# Patient Record
Sex: Female | Born: 1945 | Race: White | Hispanic: Yes | Marital: Married | Smoking: Former smoker
Health system: Southern US, Community
[De-identification: ages and names within clinical notes are randomized; demographics above are authoritative.]

## PROBLEM LIST (undated history)

## (undated) DIAGNOSIS — I4891 Unspecified atrial fibrillation: Secondary | ICD-10-CM

## (undated) DIAGNOSIS — I099 Rheumatic heart disease, unspecified: Secondary | ICD-10-CM

## (undated) DIAGNOSIS — R569 Unspecified convulsions: Secondary | ICD-10-CM

## (undated) DIAGNOSIS — E785 Hyperlipidemia, unspecified: Secondary | ICD-10-CM

## (undated) DIAGNOSIS — I639 Cerebral infarction, unspecified: Secondary | ICD-10-CM

## (undated) DIAGNOSIS — E119 Type 2 diabetes mellitus without complications: Secondary | ICD-10-CM

## (undated) HISTORY — DX: Rheumatic heart disease, unspecified: I09.9

## (undated) HISTORY — DX: Hyperlipidemia, unspecified: E78.5

## (undated) HISTORY — PX: INSERT / REPLACE / REMOVE PACEMAKER: SUR710

## (undated) HISTORY — DX: Cerebral infarction, unspecified: I63.9

## (undated) HISTORY — PX: APPENDECTOMY: SHX54

## (undated) HISTORY — DX: Unspecified convulsions: R56.9

## (undated) HISTORY — DX: Unspecified atrial fibrillation: I48.91

## (undated) HISTORY — PX: MITRAL VALVE REPLACEMENT: SHX147

---

## 2015-06-20 DIAGNOSIS — I639 Cerebral infarction, unspecified: Secondary | ICD-10-CM

## 2015-06-20 HISTORY — DX: Cerebral infarction, unspecified: I63.9

## 2015-11-12 ENCOUNTER — Telehealth: Payer: Self-pay | Admitting: Internal Medicine

## 2015-11-12 NOTE — Telephone Encounter (Signed)
Called pt and left message for pt to give our office a call back to update Fm and medical Hx and to get information to request medical records.

## 2015-11-17 ENCOUNTER — Telehealth: Payer: Self-pay | Admitting: Internal Medicine

## 2015-11-17 ENCOUNTER — Encounter: Payer: Self-pay | Admitting: Internal Medicine

## 2015-11-17 ENCOUNTER — Ambulatory Visit (INDEPENDENT_AMBULATORY_CARE_PROVIDER_SITE_OTHER): Payer: Medicare Other | Admitting: Internal Medicine

## 2015-11-17 VITALS — BP 132/72 | HR 81 | Ht 63.0 in | Wt 129.8 lb

## 2015-11-17 DIAGNOSIS — I482 Chronic atrial fibrillation, unspecified: Secondary | ICD-10-CM

## 2015-11-17 NOTE — Progress Notes (Signed)
      HPI Mrs. Fodera is referred today for ongoing evaluation and management of her PPM. She is a pleasant 70 yo woman with a h/o mitral valve disease, s/p MVR in 1984 and then again in 2002. She developed CHB and underwent PPM insertion in 1984 and then most recently in 2012. She has a Sorin device and appears to have underlying CHB in the setting of atrial fibrillation. 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 She denies chest pain.  Not on File   Current Outpatient Prescriptions  Medication Sig Dispense Refill  . carvedilol (COREG) 6.25 MG tablet Take 6.25 mg by mouth daily.    Marland Kitchen lisinopril (PRINIVIL,ZESTRIL) 5 MG tablet Take 5 mg by mouth daily.    Marland Kitchen spironolactone (ALDACTONE) 25 MG tablet Take 25 mg by mouth as directed.    . warfarin (COUMADIN) 7.5 MG tablet Take 7.5 mg by mouth daily.     No current facility-administered medications for this visit.     No past medical history on file.  ROS:   All systems reviewed and negative except as noted in the HPI.   No past surgical history on file.   No family history on file.   Social History   Social History  . Marital Status: Married    Spouse Name: N/A  . Number of Children: N/A  . Years of Education: N/A   Occupational History  . Not on file.   Social History Main Topics  . Smoking status: Never Smoker   . Smokeless tobacco: Not on file  . Alcohol Use: Not on file  . Drug Use: Not on file  . Sexual Activity: Not on file   Other Topics Concern  . Not on file   Social History Narrative  . No narrative on file     BP 132/72 mmHg  Pulse 81  Ht 5\' 3"  (1.6 m)  Wt 129 lb 12.8 oz (58.877 kg)  BMI 23.00 kg/m2  Physical Exam:  Well appearing NAD HEENT: Unremarkable Neck:  No JVD, no thyromegally Lymphatics:  No adenopathy Back:  No CVA tenderness Lungs:  Clear HEART:  Regular rate rhythm with no rubs, no clicks. Mechanical S1 closure with no murmur Abd:  soft, positive bowel  sounds, no organomegally, no rebound, no guarding Ext:  2 plus pulses, no edema, no cyanosis, no clubbing Skin:  No rashes no nodules Neuro:  CN II through XII intact, motor grossly intact  EKG - atrial fib with ventricular pacing  DEVICE  Unable to interogate as programmer not available.  Assess/Plan: 1. Atrial fib - she will continue her anti-coagulation. Her HR is well controlled. 2. Mitral valve - her valve sounds are good and no obvious murmurs. She will ultimately need a repeat echo. 3. Complete heart block - she appears to be asymptomatic, s/p PPM 4. PPM - we could not interogate her Sorin device as there was no programmed. She is pacing appropriately. We will have the Sorin programmer returned to our office.   Leonia Reeves.D.

## 2015-11-17 NOTE — Patient Instructions (Signed)
Medication Instructions:  Your physician recommends that you continue on your current medications as directed. Please refer to the Current Medication list given to you today.   Labwork: None ordered   Testing/Procedures: None ordered   Follow-Up: Your physician recommends that you schedule a follow-up appointment within the month in device clinic   Any Other Special Instructions Will Be Listed Below (If Applicable).     If you need a refill on your cardiac medications before your next appointment, please call your pharmacy.

## 2015-11-17 NOTE — Telephone Encounter (Signed)
Left VM for patient 8:15 am and called back @ 10:10 am with no return phone call. I cannot obtain records from [place]  Without patient's return call.

## 2015-11-24 ENCOUNTER — Telehealth: Payer: Self-pay | Admitting: Internal Medicine

## 2015-11-24 NOTE — Telephone Encounter (Signed)
Records received from [facility] in chart prep.

## 2015-12-20 ENCOUNTER — Ambulatory Visit (INDEPENDENT_AMBULATORY_CARE_PROVIDER_SITE_OTHER): Payer: Medicare Other | Admitting: *Deleted

## 2015-12-20 ENCOUNTER — Encounter: Payer: Self-pay | Admitting: Internal Medicine

## 2015-12-20 DIAGNOSIS — Z950 Presence of cardiac pacemaker: Secondary | ICD-10-CM | POA: Diagnosis not present

## 2015-12-20 DIAGNOSIS — I482 Chronic atrial fibrillation, unspecified: Secondary | ICD-10-CM

## 2015-12-20 NOTE — Progress Notes (Signed)
Pacemaker check in clinic. Normal device function. Threshold, impedance stable. Device programmed to maximize longevity. 2 high ventricular rates noted- both 4 seconds Pamela Walker, irregular R-R intervals. Pamela Walker is on warfarin for chronic AF. Device programmed at appropriate safety margins. Histogram distribution appropriate for patient activity level. Device programmed to optimize intrinsic conduction. Estimated longevity >5 years. Device not capable of remote follow-up. ROV with device clinic in 6 months and with GT in May 2018.

## 2015-12-23 LAB — CUP PACEART INCLINIC DEVICE CHECK
Brady Statistic RV Percent Paced: 100 %
Date Time Interrogation Session: 20170703110110
Implantable Lead Implant Date: 20120120
Implantable Lead Location: 753859
Implantable Lead Location: 753860
Lead Channel Pacing Threshold Amplitude: 0.75 V
Lead Channel Setting Pacing Amplitude: 2.5 V
Lead Channel Setting Pacing Pulse Width: 0.35 ms
MDC IDC LEAD IMPLANT DT: 20120120
MDC IDC MSMT BATTERY IMPEDANCE: 869.03 Ohm
MDC IDC MSMT LEADCHNL RV IMPEDANCE VALUE: 588.51 Ohm
MDC IDC MSMT LEADCHNL RV PACING THRESHOLD PULSEWIDTH: 0.35 ms
MDC IDC SET LEADCHNL RV SENSING SENSITIVITY: 5 mV

## 2016-01-04 ENCOUNTER — Other Ambulatory Visit: Payer: Self-pay | Admitting: *Deleted

## 2016-06-26 ENCOUNTER — Ambulatory Visit (INDEPENDENT_AMBULATORY_CARE_PROVIDER_SITE_OTHER): Payer: Medicare Other | Admitting: *Deleted

## 2016-06-26 DIAGNOSIS — Z950 Presence of cardiac pacemaker: Secondary | ICD-10-CM | POA: Diagnosis not present

## 2016-06-26 NOTE — Progress Notes (Signed)
Pacemaker check in clinic. Normal device function. Threshold, sensing, impedance consistent with previous measurements. Device programmed to maximize longevity. No high ventricular rates noted. Device programmed at appropriate safety margins. Histogram distribution appropriate for patient activity level. Device programmed to optimize intrinsic conduction. Estimated longevity 4 years. ROV w/ GT 12/2016. Patient education completed

## 2016-06-29 ENCOUNTER — Ambulatory Visit (INDEPENDENT_AMBULATORY_CARE_PROVIDER_SITE_OTHER): Payer: Medicare Other | Admitting: Pharmacist

## 2016-06-29 DIAGNOSIS — Z952 Presence of prosthetic heart valve: Secondary | ICD-10-CM | POA: Diagnosis not present

## 2016-06-29 DIAGNOSIS — I4891 Unspecified atrial fibrillation: Secondary | ICD-10-CM | POA: Insufficient documentation

## 2016-06-29 DIAGNOSIS — I639 Cerebral infarction, unspecified: Secondary | ICD-10-CM | POA: Insufficient documentation

## 2016-06-29 DIAGNOSIS — Z5181 Encounter for therapeutic drug level monitoring: Secondary | ICD-10-CM

## 2016-06-29 LAB — POCT INR: INR: 2.9

## 2016-07-24 ENCOUNTER — Ambulatory Visit (INDEPENDENT_AMBULATORY_CARE_PROVIDER_SITE_OTHER): Payer: Medicare Other

## 2016-07-24 ENCOUNTER — Ambulatory Visit (INDEPENDENT_AMBULATORY_CARE_PROVIDER_SITE_OTHER): Payer: Medicare Other | Admitting: Internal Medicine

## 2016-07-24 ENCOUNTER — Encounter: Payer: Self-pay | Admitting: Internal Medicine

## 2016-07-24 VITALS — BP 114/70 | HR 70 | Ht 63.0 in | Wt 136.4 lb

## 2016-07-24 DIAGNOSIS — I050 Rheumatic mitral stenosis: Secondary | ICD-10-CM

## 2016-07-24 DIAGNOSIS — I4891 Unspecified atrial fibrillation: Secondary | ICD-10-CM

## 2016-07-24 DIAGNOSIS — Z5181 Encounter for therapeutic drug level monitoring: Secondary | ICD-10-CM

## 2016-07-24 DIAGNOSIS — I482 Chronic atrial fibrillation: Secondary | ICD-10-CM | POA: Diagnosis not present

## 2016-07-24 DIAGNOSIS — I4821 Permanent atrial fibrillation: Secondary | ICD-10-CM

## 2016-07-24 DIAGNOSIS — I428 Other cardiomyopathies: Secondary | ICD-10-CM

## 2016-07-24 DIAGNOSIS — Z952 Presence of prosthetic heart valve: Secondary | ICD-10-CM | POA: Diagnosis not present

## 2016-07-24 DIAGNOSIS — I639 Cerebral infarction, unspecified: Secondary | ICD-10-CM

## 2016-07-24 DIAGNOSIS — I442 Atrioventricular block, complete: Secondary | ICD-10-CM

## 2016-07-24 LAB — POCT INR: INR: 3.5

## 2016-07-24 NOTE — Progress Notes (Signed)
New Outpatient Visit Date: 07/24/2016  Primary Care Provider: Elinor Dodge, MD [address] [place], [place] [address]  Chief Complaint: Establish care  HPI:  [name] is a 71 y.o. 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  The patient reports having a heart valve problem since birth, though she also endorses a history of rheumatic fever as a child.  She developed progressive shortness of breath leading up to the diagnosis of mitral stenosis in 1984 while living in [place].  She underwent mitral valve replacement with a bioprosthetic valve.  He hospitalization was complicated by complete heart block, for which permanent pacemaker was implanted.  The patient did well until 2002, at which time she developed chest pain and heart failure symptoms.  She had undergone a dental procedure about 6 months earlier, for which she did not receive antibiotic prophylaxis.  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  Since her valve replacement, she has suffered multiple strokes as well as at least 1 seizure last year that was attributed to her stroke.  Today, [name] reports feeling well.  She is accompanied by her husband and a [place] interpreter.  [name] denies chest pain, shortness of breath, palpitations, lightheadedness, edema, orthopnea, and PND.  She is compliant with her mediations, including warfarin that is managed by our anticoagulation clinic.  She follows with Dr. Ladona Ridgel in regard to her pacemaker and chronic atrial fibrillation.  She was followed by [facility] in [facility], having last been seen in 02/2016.  Last summer, she had a syncopal episode that was felt to be  due to a seizure.  Echo at the time was notable for mildly reduced LV function and normal mitral valve function.  Repeat limited echo in 02/2016 noted normalization of LVEF.  The patient's husband reports a cardiac catheterization without significant CAD prior to [name]'s valve replacement in 2002.  The patient does not exercise routinely, as she is afraid to leave her home alone.  Her husband notes that she is confused quite a bit, which has been most pronounced following a significant stroke prior to moving to [place] last year.  --------------------------------------------------------------------------------------------------  Cardiovascular History & Procedures: Cardiovascular Problems:  Mitral stenosis status post MVR x 2 (first MVR complicated by endocarditis)  Atrial fibrillation  Complete heart block status post pacemaker  Stroke  Risk Factors:  Hypertension, hyperlipidemia, and age > 45  Cath/PCI:  None available  CV Surgery:  Mitral valve replacement (1984 and 2003)  EP Procedures and Devices:  Pacemaker (Sorin)  Non-Invasive Evaluation(s):  Limited TTE (03/13/16): Normal LV size and function (EF 60%).  No obvious vegetation.  TTE (01/24/16): Normal LV size with LVEF 45-50% and global hypokinesis.  Left atrium severely dilated.  Normal RV size and function.  Mitral valve prosthesis with trace regurgitation.  Pacing wire noted in right heart.  Recent CV Pertinent Labs: Lab Results  Component Value Date   INR 3.5 07/24/2016    --------------------------------------------------------------------------------------------------  No past medical history on file.  No past surgical history on file.  Outpatient Encounter Prescriptions as of 07/24/2016  Medication Sig  . carvedilol (COREG) 6.25 MG tablet Take 6.25 mg by mouth 2 (two) times daily.  . Cholecalciferol (VITAMIN D3) 1000 units  CAPS Take 1 tablet by mouth daily.  Marland Kitchen gemfibrozil (LOPID) 600 MG tablet Take 600  mg by mouth daily.  Marland Kitchen levETIRAcetam (KEPPRA) 500 MG tablet Take 500 mg by mouth 2 (two) times daily.  Marland Kitchen lisinopril (PRINIVIL,ZESTRIL) 5 MG tablet Take 5 mg by mouth daily.  Marland Kitchen lovastatin (MEVACOR) 40 MG tablet Take 40 mg by mouth daily.  . metFORMIN (GLUCOPHAGE-XR) 500 MG 24 hr tablet Take 500 mg by mouth daily.  Marland Kitchen spironolactone (ALDACTONE) 25 MG tablet Take 25 mg by mouth as directed. MWF  . warfarin (COUMADIN) 7.5 MG tablet Take 7.5 mg by mouth daily.  Marland Kitchen zolpidem (AMBIEN) 10 MG tablet Take 10 mg by mouth at bedtime.   No facility-administered encounter medications on file as of 07/24/2016.     Allergies: Patient has no allergy information on record.  Social History   Social History  . Marital status: Married    Spouse name: N/A  . Number of children: N/A  . Years of education: N/A   Occupational History  . Not on file.   Social History Main Topics  . Smoking status: Never Smoker  . Smokeless tobacco: Never Used  . Alcohol use Not on file  . Drug use: Unknown  . Sexual activity: Not on file   Other Topics Concern  . Not on file   Social History Narrative  . No narrative on file    No family history on file.  Review of Systems: A 12-system review of systems was performed and was negative except as noted in the HPI.  --------------------------------------------------------------------------------------------------  Physical Exam: BP 114/70 (BP Location: Right Arm, Patient Position: Sitting, Cuff Size: Normal)   Pulse 70   Ht 5\' 3"  (1.6 m)   Wt 136 lb 6.4 oz (61.9 kg)   SpO2 95%   BMI 24.16 kg/m   General:  Well-developed, well-nourished woman, seated comfortably in the exam room.  She is accompanied by her husband and a [place] interpreter. HEENT: No conjunctival pallor or scleral icterus.  Moist mucous membranes.  OP clear. Neck: Supple without lymphadenopathy, thyromegaly, JVD, or HJR.  No carotid bruit. Lungs: Normal work of breathing.  Clear to auscultation  bilaterally without wheezes or crackles. Heart: Regular rate and rhythm with mechanical S1 and prominent S2.  No murmurs, rubs, or gallops.  Non-displaced PMI. Abd: Bowel sounds present.  Soft, NT/ND without hepatosplenomegaly Ext: No lower extremity edema.  Radial, PT, and DP pulses are 2+ bilaterally Skin: warm and dry without rash Neuro: CNIII-XII intact.  Strength and fine-touch sensation intact in upper and lower extremities bilaterally. Psych: Normal mood and affect.  EKG:  Ventricularly paced without P-waves; most likely baseline atrial fibrillation.  No significant change from prior tracing on 11/17/15 (I have personally reviewed both tracings).  Outside labs: CBC (03/13/16): WBC 4.6, HGB 10.8, HCT 32.6, Platelets 154  BMP (03/13/16): Na 141, K 4.4, Cl 103, CO2 26, BUN 17, Creatinine 0.85, Glucose 92, Ca 9.2  Lipid panel (03/13/16): Total cholesterol 178, Triglycerides 97, HDL 63, LDL 96  --------------------------------------------------------------------------------------------------  ASSESSMENT AND PLAN: Mitral stenosis status post MVR x 2 Patient appears euvolemic and well-compensated on exam today.  Most recent outside echo report form 02/2016 indicates normal prosthesis function with preserved LVEF.  She will need to remain on lifelong anticoagulation with warfarin with a target INR of 2.5-3.5.  Non-ischemic cardiomyopathy Patient was noted to have mildly reduced LVEF last summer, which had returned to normal on follow-up echo.  As above, she appears euvolemic  and well-compensated today.  We will continue her current regimen of carvedilol and lisinopril.  Stroke Patient is currently on warfarin, gemfibrozil, and lovastatin.  Most recent LDL was 96 in 02/2016.  I suspect prior strokes were most likely cardioembolic related to MVR and a-fib.  We will not make any medication changes today, though escalation to a high-intensity statin could be considered if we uncover any evidence of  atherosclerotic CVD.  The patient should continue routine follow-up with her neurologist.  Permanent atrial fibrillation and complete heart block Patient remains in paced rhythm today.  We will defer further management to Dr. Ladona Ridgel.  Follow-up: Return to clinic in 6 months.  Yvonne Kendall, MD 07/24/2016 9:58 AM

## 2016-07-24 NOTE — Patient Instructions (Addendum)
Medication Instructions:  Your physician recommends that you continue on your current medications as directed. Please refer to the Current Medication list given to you today.   Labwork: None   Testing/Procedures: None   Follow-Up: Your physician wants you to follow-up in: 6 months with Dr End. (August 2018). You will receive a reminder letter in the mail two months in advance. If you don't receive a letter, please call our office to schedule the follow-up appointment.        If you need a refill on your cardiac medications before your next appointment, please call your pharmacy.   

## 2016-08-21 ENCOUNTER — Ambulatory Visit (INDEPENDENT_AMBULATORY_CARE_PROVIDER_SITE_OTHER): Payer: Medicare Other | Admitting: *Deleted

## 2016-08-21 DIAGNOSIS — Z5181 Encounter for therapeutic drug level monitoring: Secondary | ICD-10-CM

## 2016-08-21 DIAGNOSIS — I482 Chronic atrial fibrillation: Secondary | ICD-10-CM | POA: Diagnosis not present

## 2016-08-21 DIAGNOSIS — Z952 Presence of prosthetic heart valve: Secondary | ICD-10-CM | POA: Diagnosis not present

## 2016-08-21 DIAGNOSIS — I4821 Permanent atrial fibrillation: Secondary | ICD-10-CM

## 2016-08-21 LAB — POCT INR: INR: 5.2

## 2016-09-04 ENCOUNTER — Ambulatory Visit (INDEPENDENT_AMBULATORY_CARE_PROVIDER_SITE_OTHER): Payer: Medicare Other | Admitting: *Deleted

## 2016-09-04 DIAGNOSIS — I482 Chronic atrial fibrillation: Secondary | ICD-10-CM | POA: Diagnosis not present

## 2016-09-04 DIAGNOSIS — Z952 Presence of prosthetic heart valve: Secondary | ICD-10-CM

## 2016-09-04 DIAGNOSIS — I4821 Permanent atrial fibrillation: Secondary | ICD-10-CM

## 2016-09-04 DIAGNOSIS — Z5181 Encounter for therapeutic drug level monitoring: Secondary | ICD-10-CM | POA: Diagnosis not present

## 2016-09-04 LAB — POCT INR: INR: 1.6

## 2016-09-15 ENCOUNTER — Ambulatory Visit (INDEPENDENT_AMBULATORY_CARE_PROVIDER_SITE_OTHER): Payer: Medicare Other | Admitting: *Deleted

## 2016-09-15 DIAGNOSIS — Z952 Presence of prosthetic heart valve: Secondary | ICD-10-CM

## 2016-09-15 DIAGNOSIS — Z5181 Encounter for therapeutic drug level monitoring: Secondary | ICD-10-CM

## 2016-09-15 LAB — POCT INR: INR: 1.6

## 2016-09-22 ENCOUNTER — Ambulatory Visit (INDEPENDENT_AMBULATORY_CARE_PROVIDER_SITE_OTHER): Payer: Medicare Other | Admitting: *Deleted

## 2016-09-22 ENCOUNTER — Encounter (INDEPENDENT_AMBULATORY_CARE_PROVIDER_SITE_OTHER): Payer: Self-pay

## 2016-09-22 DIAGNOSIS — Z5181 Encounter for therapeutic drug level monitoring: Secondary | ICD-10-CM

## 2016-09-22 DIAGNOSIS — Z952 Presence of prosthetic heart valve: Secondary | ICD-10-CM

## 2016-09-22 LAB — POCT INR: INR: 3.6

## 2016-10-02 ENCOUNTER — Ambulatory Visit (INDEPENDENT_AMBULATORY_CARE_PROVIDER_SITE_OTHER): Payer: Medicare Other | Admitting: Pharmacist

## 2016-10-02 DIAGNOSIS — Z5181 Encounter for therapeutic drug level monitoring: Secondary | ICD-10-CM

## 2016-10-02 DIAGNOSIS — Z952 Presence of prosthetic heart valve: Secondary | ICD-10-CM

## 2016-10-02 LAB — POCT INR: INR: 2.6

## 2016-10-16 ENCOUNTER — Ambulatory Visit (INDEPENDENT_AMBULATORY_CARE_PROVIDER_SITE_OTHER): Payer: Medicare Other | Admitting: *Deleted

## 2016-10-16 DIAGNOSIS — Z5181 Encounter for therapeutic drug level monitoring: Secondary | ICD-10-CM | POA: Diagnosis not present

## 2016-10-16 DIAGNOSIS — I4891 Unspecified atrial fibrillation: Secondary | ICD-10-CM

## 2016-10-16 DIAGNOSIS — Z952 Presence of prosthetic heart valve: Secondary | ICD-10-CM

## 2016-10-16 LAB — POCT INR: INR: 3.8

## 2016-10-30 ENCOUNTER — Telehealth: Payer: Self-pay | Admitting: Internal Medicine

## 2016-10-30 ENCOUNTER — Ambulatory Visit (INDEPENDENT_AMBULATORY_CARE_PROVIDER_SITE_OTHER): Payer: Medicare Other | Admitting: Pharmacist

## 2016-10-30 DIAGNOSIS — I4891 Unspecified atrial fibrillation: Secondary | ICD-10-CM

## 2016-10-30 DIAGNOSIS — Z952 Presence of prosthetic heart valve: Secondary | ICD-10-CM

## 2016-10-30 DIAGNOSIS — Z5181 Encounter for therapeutic drug level monitoring: Secondary | ICD-10-CM | POA: Diagnosis not present

## 2016-10-30 LAB — PROTIME-INR
INR: 8.4 (ref 0.8–1.2)
PROTHROMBIN TIME: 81.7 s — AB (ref 9.1–12.0)

## 2016-10-30 LAB — POCT INR: INR: >8

## 2016-10-30 NOTE — Telephone Encounter (Signed)
SEE LAB  RESULTS  PT  ALREADY AWARE OF   RECOMMENDATIONS .Pamela Walker

## 2016-10-30 NOTE — Telephone Encounter (Signed)
Follow Up: ° ° ° °Returning call,concerning her lab results. °

## 2016-11-03 ENCOUNTER — Encounter (INDEPENDENT_AMBULATORY_CARE_PROVIDER_SITE_OTHER): Payer: Self-pay

## 2016-11-03 ENCOUNTER — Ambulatory Visit (INDEPENDENT_AMBULATORY_CARE_PROVIDER_SITE_OTHER): Payer: Medicare Other | Admitting: *Deleted

## 2016-11-03 DIAGNOSIS — I4891 Unspecified atrial fibrillation: Secondary | ICD-10-CM | POA: Diagnosis not present

## 2016-11-03 DIAGNOSIS — Z952 Presence of prosthetic heart valve: Secondary | ICD-10-CM

## 2016-11-03 DIAGNOSIS — Z5181 Encounter for therapeutic drug level monitoring: Secondary | ICD-10-CM | POA: Diagnosis not present

## 2016-11-03 LAB — POCT INR: INR: 1.2

## 2016-11-10 ENCOUNTER — Ambulatory Visit (INDEPENDENT_AMBULATORY_CARE_PROVIDER_SITE_OTHER): Payer: Medicare Other | Admitting: Pharmacist

## 2016-11-10 DIAGNOSIS — Z952 Presence of prosthetic heart valve: Secondary | ICD-10-CM

## 2016-11-10 DIAGNOSIS — Z5181 Encounter for therapeutic drug level monitoring: Secondary | ICD-10-CM | POA: Diagnosis not present

## 2016-11-10 DIAGNOSIS — I4891 Unspecified atrial fibrillation: Secondary | ICD-10-CM

## 2016-11-10 LAB — POCT INR: INR: 4.3

## 2016-11-27 ENCOUNTER — Ambulatory Visit (INDEPENDENT_AMBULATORY_CARE_PROVIDER_SITE_OTHER): Payer: Medicare Other | Admitting: Pharmacist

## 2016-11-27 DIAGNOSIS — I4891 Unspecified atrial fibrillation: Secondary | ICD-10-CM | POA: Diagnosis not present

## 2016-11-27 DIAGNOSIS — Z952 Presence of prosthetic heart valve: Secondary | ICD-10-CM

## 2016-11-27 DIAGNOSIS — Z5181 Encounter for therapeutic drug level monitoring: Secondary | ICD-10-CM | POA: Diagnosis not present

## 2016-11-27 LAB — POCT INR: INR: 3.7

## 2016-12-13 ENCOUNTER — Ambulatory Visit (INDEPENDENT_AMBULATORY_CARE_PROVIDER_SITE_OTHER): Payer: Medicare Other | Admitting: *Deleted

## 2016-12-13 DIAGNOSIS — Z952 Presence of prosthetic heart valve: Secondary | ICD-10-CM

## 2016-12-13 DIAGNOSIS — Z5181 Encounter for therapeutic drug level monitoring: Secondary | ICD-10-CM | POA: Diagnosis not present

## 2016-12-13 DIAGNOSIS — I4891 Unspecified atrial fibrillation: Secondary | ICD-10-CM

## 2016-12-13 LAB — POCT INR: INR: 2.5

## 2017-01-01 ENCOUNTER — Encounter: Payer: Self-pay | Admitting: Internal Medicine

## 2017-01-01 ENCOUNTER — Ambulatory Visit (INDEPENDENT_AMBULATORY_CARE_PROVIDER_SITE_OTHER): Payer: Medicare Other | Admitting: Internal Medicine

## 2017-01-01 ENCOUNTER — Ambulatory Visit (INDEPENDENT_AMBULATORY_CARE_PROVIDER_SITE_OTHER): Payer: Medicare Other | Admitting: *Deleted

## 2017-01-01 VITALS — BP 108/68 | HR 70 | Ht 63.0 in | Wt 130.2 lb

## 2017-01-01 DIAGNOSIS — Z952 Presence of prosthetic heart valve: Secondary | ICD-10-CM

## 2017-01-01 DIAGNOSIS — I4891 Unspecified atrial fibrillation: Secondary | ICD-10-CM

## 2017-01-01 DIAGNOSIS — Z950 Presence of cardiac pacemaker: Secondary | ICD-10-CM | POA: Diagnosis not present

## 2017-01-01 DIAGNOSIS — I442 Atrioventricular block, complete: Secondary | ICD-10-CM

## 2017-01-01 DIAGNOSIS — Z5181 Encounter for therapeutic drug level monitoring: Secondary | ICD-10-CM | POA: Diagnosis not present

## 2017-01-01 LAB — POCT INR: INR: 3.2

## 2017-01-01 NOTE — Progress Notes (Signed)
HPI Pamela Walker is referred today for ongoing evaluation and management of her PPM. She is a pleasant 71 yo woman with a h/o mitral valve disease, s/p MVR in 1984 and then again in 2002. She developed CHB and underwent PPM insertion in 1984 and then most recently in 2012. She has a Sorin device and appears to have underlying CHB in the setting of atrial fibrillation. 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 She denies chest pain.  No Known Allergies   Current Outpatient Prescriptions  Medication Sig Dispense Refill  . atorvastatin (LIPITOR) 40 MG tablet Take 1 tablet by mouth daily.    . carvedilol (COREG) 6.25 MG tablet Take 6.25 mg by mouth 2 (two) times daily.    . Cholecalciferol (VITAMIN D3) 1000 units CAPS Take 1 tablet by mouth daily.    Marland Kitchen gemfibrozil (LOPID) 600 MG tablet Take 600 mg by mouth daily.    Marland Kitchen levETIRAcetam (KEPPRA) 500 MG tablet Take 500 mg by mouth 2 (two) times daily.    Marland Kitchen lisinopril (PRINIVIL,ZESTRIL) 5 MG tablet Take 5 mg by mouth daily.    . metFORMIN (GLUCOPHAGE-XR) 500 MG 24 hr tablet Take 500 mg by mouth daily.    Marland Kitchen spironolactone (ALDACTONE) 25 MG tablet Take 25 mg by mouth as directed. MWF    . warfarin (COUMADIN) 7.5 MG tablet Take 7.5 mg by mouth daily.    Marland Kitchen zolpidem (AMBIEN) 10 MG tablet Take 10 mg by mouth at bedtime.     No current facility-administered medications for this visit.      Past Medical History:  Diagnosis Date  . Atrial fibrillation (HCC)   . Hyperlipidemia   . Rheumatic fever/heart disease   . Seizure (HCC)   . Stroke (HCC)     ROS:   All systems reviewed and negative except as noted in the HPI.   Past Surgical History:  Procedure Laterality Date  . APPENDECTOMY    . CESAREAN SECTION    . INSERT / REPLACE / REMOVE PACEMAKER    . MITRAL VALVE REPLACEMENT       Family History  Problem Relation Age of Onset  . Heart disease Mother        angina  . Arrhythmia Sister        Pacer     Social  History   Social History  . Marital status: Married    Spouse name: N/A  . Number of children: N/A  . Years of education: N/A   Occupational History  . Not on file.   Social History Main Topics  . Smoking status: Former Smoker    Packs/day: 2.00    Years: 10.00    Types: Cigarettes    Quit date: 85  . Smokeless tobacco: Never Used  . Alcohol use No     Comment: 1 glass of wine every few months.  . Drug use: No  . Sexual activity: Not on file   Other Topics Concern  . Not on file   Social History Narrative  . No narrative on file     BP 108/68   Pulse 70   Ht 5\' 3"  (1.6 m)   Wt 130 lb 3.2 oz (59.1 kg)   SpO2 96%   BMI 23.06 kg/m   Physical Exam:  Well appearing NAD HEENT: Unremarkable Neck:  No JVD, no thyromegally Lymphatics:  No adenopathy Back:  No CVA tenderness Lungs:  Clear HEART:  Regular rate  rhythm with no rubs, no clicks. Mechanical S1 closure with no murmur Abd:  soft, positive bowel sounds, no organomegally, no rebound, no guarding Ext:  2 plus pulses, no edema, no cyanosis, no clubbing Skin:  No rashes no nodules Neuro:  CN II through XII intact, motor grossly intact  EKG - atrial fib with ventricular pacing  DEVICE  Unable to interogate as programmer not available.  Assess/Plan: 1. Atrial fib - she will continue her anti-coagulation. Her HR is well controlled. 2. Mitral valve - her valve sounds are good and no obvious murmurs. She will ultimately need a repeat echo. 3. Complete heart block - she appears to be asymptomatic, s/p PPM 4. PPM - we could not interogate her Sorin device as there was no programmed. She is pacing appropriately. We will have the Sorin programmer returned to our office.   Leonia Reeves.D.

## 2017-01-01 NOTE — Patient Instructions (Signed)
Medication Instructions:  Your physician recommends that you continue on your current medications as directed. Please refer to the Current Medication list given to you today.   Labwork: None Ordered   Testing/Procedures: None Ordered   Follow-Up: Your physician wants you to follow-up in: 6 months with Dr. Taylor. You will receive a reminder letter in the mail two months in advance. If you don't receive a letter, please call our office to schedule the follow-up appointment.    Any Other Special Instructions Will Be Listed Below (If Applicable).     If you need a refill on your cardiac medications before your next appointment, please call your pharmacy.   

## 2017-01-22 ENCOUNTER — Ambulatory Visit (INDEPENDENT_AMBULATORY_CARE_PROVIDER_SITE_OTHER): Payer: Medicare Other | Admitting: Pharmacist

## 2017-01-22 DIAGNOSIS — I4891 Unspecified atrial fibrillation: Secondary | ICD-10-CM

## 2017-01-22 DIAGNOSIS — Z5181 Encounter for therapeutic drug level monitoring: Secondary | ICD-10-CM | POA: Diagnosis not present

## 2017-01-22 DIAGNOSIS — Z952 Presence of prosthetic heart valve: Secondary | ICD-10-CM

## 2017-01-22 LAB — POCT INR: INR: 2.2

## 2017-02-12 ENCOUNTER — Ambulatory Visit (INDEPENDENT_AMBULATORY_CARE_PROVIDER_SITE_OTHER): Payer: Medicare Other | Admitting: Pharmacist

## 2017-02-12 DIAGNOSIS — Z5181 Encounter for therapeutic drug level monitoring: Secondary | ICD-10-CM

## 2017-02-12 DIAGNOSIS — Z952 Presence of prosthetic heart valve: Secondary | ICD-10-CM | POA: Diagnosis not present

## 2017-02-12 LAB — POCT INR: INR: 4.2

## 2017-02-16 ENCOUNTER — Encounter: Payer: Self-pay | Admitting: *Deleted

## 2017-02-26 ENCOUNTER — Ambulatory Visit (INDEPENDENT_AMBULATORY_CARE_PROVIDER_SITE_OTHER): Payer: Medicare Other | Admitting: *Deleted

## 2017-02-26 DIAGNOSIS — Z5181 Encounter for therapeutic drug level monitoring: Secondary | ICD-10-CM

## 2017-02-26 DIAGNOSIS — Z952 Presence of prosthetic heart valve: Secondary | ICD-10-CM | POA: Diagnosis not present

## 2017-02-26 DIAGNOSIS — I4891 Unspecified atrial fibrillation: Secondary | ICD-10-CM

## 2017-02-26 LAB — POCT INR: INR: 4.1

## 2017-03-09 ENCOUNTER — Encounter: Payer: Self-pay | Admitting: Internal Medicine

## 2017-03-09 ENCOUNTER — Ambulatory Visit (INDEPENDENT_AMBULATORY_CARE_PROVIDER_SITE_OTHER): Payer: Medicare Other | Admitting: Pharmacist

## 2017-03-09 ENCOUNTER — Ambulatory Visit (INDEPENDENT_AMBULATORY_CARE_PROVIDER_SITE_OTHER): Payer: Medicare Other | Admitting: Internal Medicine

## 2017-03-09 VITALS — BP 122/64 | HR 86 | Ht 63.0 in | Wt 128.0 lb

## 2017-03-09 DIAGNOSIS — I442 Atrioventricular block, complete: Secondary | ICD-10-CM | POA: Diagnosis not present

## 2017-03-09 DIAGNOSIS — Z5181 Encounter for therapeutic drug level monitoring: Secondary | ICD-10-CM | POA: Diagnosis not present

## 2017-03-09 DIAGNOSIS — Z952 Presence of prosthetic heart valve: Secondary | ICD-10-CM

## 2017-03-09 DIAGNOSIS — I4821 Permanent atrial fibrillation: Secondary | ICD-10-CM

## 2017-03-09 DIAGNOSIS — I428 Other cardiomyopathies: Secondary | ICD-10-CM | POA: Insufficient documentation

## 2017-03-09 DIAGNOSIS — I482 Chronic atrial fibrillation: Secondary | ICD-10-CM

## 2017-03-09 DIAGNOSIS — I099 Rheumatic heart disease, unspecified: Secondary | ICD-10-CM | POA: Diagnosis not present

## 2017-03-09 LAB — POCT INR: INR: 5

## 2017-03-09 MED ORDER — CARVEDILOL 6.25 MG PO TABS: 6.2500 mg | ORAL_TABLET | Freq: Two times a day (BID) | ORAL | 1 refills | Status: DC

## 2017-03-09 MED ORDER — AMOXICILLIN 500 MG PO TABS: ORAL_TABLET | ORAL | 3 refills | Status: DC

## 2017-03-09 MED ORDER — LISINOPRIL 5 MG PO TABS: 5.0000 mg | ORAL_TABLET | Freq: Every day | ORAL | 1 refills | Status: DC

## 2017-03-09 NOTE — Progress Notes (Signed)
Follow-up Outpatient Visit Date: 03/09/2017  Primary Care Provider: Elinor Dodge, MD [address] [place] [address]  Chief Complaint: Follow-up nonischemic cardiomyopathy and rheumatic heart disease status post MVR 2  HPI:  Pamela Walker is a 71 y.o. 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 Today, Pamela Walker reports feeling well. She denies chest pain, shortness of breath, palpitations, lightheadedness, orthopnea, PND, and edema. She notes that her INRs have been therapeutic for several weeks now; she has not had any significant bleeding. Her husband notes that Pamela Walker has not been eating much in the way of green vegetables. She does not go outside, as she is fearful of insect bites. She has been trying to walk around the house as well as at this work. She has using prophylactic antibiotics prior to dental work but needs a new prescription today. Pamela Walker is scheduled for follow-up with her PCP next month, at which time routine labs are to be obtained.  --------------------------------------------------------------------------------------------------  Cardiovascular History & Procedures: Cardiovascular Problems:  Mitral stenosis status post MVR x 2 (first MVR complicated by endocarditis)  Atrial fibrillation  Complete heart block status post pacemaker  Stroke  Risk Factors:  Hypertension, hyperlipidemia, and age > 83  Cath/PCI:  None available  CV Surgery:  Mitral valve replacement (1984 and 2003)  EP Procedures and Devices:  Pacemaker (Sorin)  Non-Invasive Evaluation(s):  Limited TTE (03/13/16): Normal LV size and function (EF 60%).  No obvious vegetation.  TTE (01/24/16): Normal LV size with LVEF 45-50% and global  hypokinesis.  Left atrium severely dilated.  Normal RV size and function.  Mitral valve prosthesis with trace regurgitation.  Pacing wire noted in right heart.  Recent CV Pertinent Labs: Lab Results  Component Value Date   INR 5.0 03/09/2017   INR 8.4 (>) 10/30/2016    Past medical and surgical history were reviewed and updated in EPIC.  Current Meds  Medication Sig  . atorvastatin (LIPITOR) 40 MG tablet Take 1 tablet by mouth daily.  . carvedilol (COREG) 6.25 MG tablet Take 6.25 mg by mouth 2 (two) times daily.  . Cholecalciferol (VITAMIN D3) 1000 units CAPS Take 1 tablet by mouth daily.  Marland Kitchen gemfibrozil (LOPID) 600 MG tablet Take 600 mg by mouth daily.  Marland Kitchen levETIRAcetam (KEPPRA) 500 MG tablet Take 500 mg by mouth 2 (two) times daily.  Marland Kitchen lisinopril (PRINIVIL,ZESTRIL) 5 MG tablet Take 5 mg by mouth daily.  . metFORMIN (GLUCOPHAGE-XR) 500 MG 24 hr tablet Take 500 mg by mouth daily.  Marland Kitchen spironolactone (ALDACTONE) 25 MG tablet Take 25 mg by mouth as directed. MWF  . warfarin (COUMADIN) 7.5 MG tablet Take 7.5 mg by mouth daily.  Marland Kitchen zolpidem (AMBIEN) 10 MG tablet Take 10 mg by mouth at bedtime.    Allergies: Patient has no known allergies.  Social History   Social History  . Marital status: Married    Spouse name: N/A  . Number of children: N/A  . Years of education: N/A   Occupational History  . Not on file.   Social History Main Topics  . Smoking status: Former Smoker    Packs/day: 2.00    Years: 10.00    Types: Cigarettes    Quit date: 41  . Smokeless tobacco: Never Used  . Alcohol use No  Comment: 1 glass of wine every few months.  . Drug use: No  . Sexual activity: Not on file   Other Topics Concern  . Not on file   Social History Narrative  . No narrative on file    Family History  Problem Relation Age of Onset  . Heart disease Mother        angina  . Arrhythmia Sister        Pacer    Review of Systems: Review of Systems  Constitutional: Negative.     HENT: Negative.   Eyes: Negative.   Respiratory: Negative.   Cardiovascular: Negative.   Gastrointestinal: Positive for constipation.  Genitourinary: Negative.   Musculoskeletal: Positive for myalgias (Intermittent leg pain).  Skin: Negative.   Neurological: Negative.   Endo/Heme/Allergies: Negative.   Psychiatric/Behavioral: Positive for depression.    --------------------------------------------------------------------------------------------------  Physical Exam: BP 122/64   Pulse 86   Ht 5\' 3"  (1.6 m)   Wt 128 lb (58.1 kg)   SpO2 95%   BMI 22.67 kg/m   General:  Well-developed well-nourished woman, seated comfortably in the exam room. She is accompanied by her husband. HEENT: No conjunctival pallor or scleral icterus. Moist mucous membranes.  OP clear. Neck: Supple without lymphadenopathy, thyromegaly, JVD, or HJR. No carotid bruit. Lungs: Normal work of breathing. Clear to auscultation bilaterally without wheezes or crackles. Heart: Regular rate and rhythm with mechanical S1. No murmurs. Nondisplaced PMI. Abd: Bowel sounds present. Soft, NT/ND without hepatosplenomegaly Ext: No lower extremity edema. Radial, PT, and DP pulses are 2+ bilaterally. Skin: Warm and dry without rash.  No results found for: WBC, HGB, HCT, MCV, PLT  No results found for: NA, K, CL, CO2, BUN, CREATININE, GLUCOSE, ALT  No results found for: CHOL, HDL, LDLCALC, LDLDIRECT, TRIG, CHOLHDL  --------------------------------------------------------------------------------------------------  ASSESSMENT AND PLAN: Nonischemic cardiomyopathy Pamela Walker appears euvolemic and well compensated. We will continue her current medications, including carvedilol, lisinopril, and spironolactone. LVEF by most recent echo in 2017 was normal. Pamela Walker reports that labs will be drawn by her PCP next month. I have asked her to have her PCP fax these labs to [place] for our records.  Rheumatic heart disease status post  mitral valve replacement Exam demonstrates crisp heart sounds without murmurs. No symptoms to suggest valve dysfunction. INR has been supratherapeutic but is being managed by the Coumadin clinic here. Once her INR is back to target range (2.5-3.5), I recommend addition of low-dose aspirin. A new prescription for prophylactic amoxicillin will be given today, to be taken prior to any dental procedure.  Permanent atrial fibrillation and complete heart block Continue indefinite warfarin and planned follow-up in EP clinic.  Follow-up: Return to clinic in 6 months.  Yvonne Kendall, MD 03/09/2017 7:52 AM

## 2017-03-09 NOTE — Patient Instructions (Addendum)
Medication Instructions:  Your physician recommends that you continue on your current medications as directed. Please refer to the Current Medication list given to you today.  Dr End recommends that you start aspirin 81 mg daily when your INR is in range. I will let the Coumadin Clinic know, also.   I have given you a prescription for amoxicillin (antibiotic) to take before you go the the dentist. It will be 4 tablets (2 g) 30-60 minutes before your appointment. Labwork: None   Testing/Procedures: None   Follow-Up: Your physician wants you to follow-up in: 6 months with Dr End. (March 2019).  You will receive a reminder letter in the mail two months in advance. If you don't receive a letter, please call our office to schedule the follow-up appointment.   Any Other Special Instructions Will Be Listed Below (If Applicable).  When you see your primary care doctor next month please ask them to fax a copy of your lab to Dr End fax number [phone].   If you need a refill on your cardiac medications before your next appointment, please call your pharmacy.

## 2017-03-26 ENCOUNTER — Ambulatory Visit (INDEPENDENT_AMBULATORY_CARE_PROVIDER_SITE_OTHER): Payer: Medicare Other

## 2017-03-26 DIAGNOSIS — Z5181 Encounter for therapeutic drug level monitoring: Secondary | ICD-10-CM | POA: Diagnosis not present

## 2017-03-26 DIAGNOSIS — Z952 Presence of prosthetic heart valve: Secondary | ICD-10-CM

## 2017-03-26 DIAGNOSIS — I4891 Unspecified atrial fibrillation: Secondary | ICD-10-CM | POA: Diagnosis not present

## 2017-03-26 LAB — POCT INR: INR: 2.1

## 2017-04-02 ENCOUNTER — Telehealth: Payer: Self-pay | Admitting: *Deleted

## 2017-04-02 NOTE — Telephone Encounter (Signed)
Rodriguez-Guzman, Raquel, RPH         INR was 2.1 on 03/26/2017. Next appointment due 04/09/17  Okay to start aspirin per DR End noted    Copied from staff message 03/30/17.  I spoke with patient through [employer] ID # [id number]. Pt advised to start aspirin 81 mg daily, verbalized understanding, aware to keep appt in [facility] 04/09/17.

## 2017-04-16 ENCOUNTER — Ambulatory Visit (INDEPENDENT_AMBULATORY_CARE_PROVIDER_SITE_OTHER): Payer: Medicare Other | Admitting: Pharmacist

## 2017-04-16 DIAGNOSIS — Z5181 Encounter for therapeutic drug level monitoring: Secondary | ICD-10-CM | POA: Diagnosis not present

## 2017-04-16 DIAGNOSIS — Z952 Presence of prosthetic heart valve: Secondary | ICD-10-CM | POA: Diagnosis not present

## 2017-04-16 LAB — POCT INR: INR: 2

## 2017-04-30 ENCOUNTER — Ambulatory Visit (INDEPENDENT_AMBULATORY_CARE_PROVIDER_SITE_OTHER): Payer: Medicare Other | Admitting: *Deleted

## 2017-04-30 DIAGNOSIS — I4891 Unspecified atrial fibrillation: Secondary | ICD-10-CM

## 2017-04-30 DIAGNOSIS — Z952 Presence of prosthetic heart valve: Secondary | ICD-10-CM

## 2017-04-30 DIAGNOSIS — Z5181 Encounter for therapeutic drug level monitoring: Secondary | ICD-10-CM

## 2017-04-30 DIAGNOSIS — I6389 Other cerebral infarction: Secondary | ICD-10-CM | POA: Diagnosis not present

## 2017-04-30 LAB — POCT INR: INR: 2.1

## 2017-04-30 NOTE — Progress Notes (Signed)
04/30/17-2.1; Instructions: Take 1 tablet today and 1.5 tablets tomorrow, then start taking 1 tablet daily except 1/2 tablet on Fridays. Recheck INR in 2 weeks.

## 2017-05-14 ENCOUNTER — Ambulatory Visit (INDEPENDENT_AMBULATORY_CARE_PROVIDER_SITE_OTHER): Payer: Medicare Other | Admitting: Pharmacist

## 2017-05-14 DIAGNOSIS — I6389 Other cerebral infarction: Secondary | ICD-10-CM

## 2017-05-14 DIAGNOSIS — Z5181 Encounter for therapeutic drug level monitoring: Secondary | ICD-10-CM | POA: Diagnosis not present

## 2017-05-14 DIAGNOSIS — I4891 Unspecified atrial fibrillation: Secondary | ICD-10-CM | POA: Diagnosis not present

## 2017-05-14 DIAGNOSIS — Z952 Presence of prosthetic heart valve: Secondary | ICD-10-CM

## 2017-05-14 LAB — POCT INR: INR: 2.9

## 2017-05-14 MED ORDER — WARFARIN SODIUM 7.5 MG PO TABS: 7.5000 mg | ORAL_TABLET | ORAL | 1 refills | Status: DC

## 2017-05-14 NOTE — Patient Instructions (Signed)
Continue taking 1 tablet daily except 1/2 tablet on Fridays. Recheck INR in 3 weeks. Coumadin Clinic [phone]

## 2017-06-04 ENCOUNTER — Ambulatory Visit (INDEPENDENT_AMBULATORY_CARE_PROVIDER_SITE_OTHER): Payer: Medicare Other | Admitting: *Deleted

## 2017-06-04 DIAGNOSIS — I4891 Unspecified atrial fibrillation: Secondary | ICD-10-CM

## 2017-06-04 DIAGNOSIS — I6389 Other cerebral infarction: Secondary | ICD-10-CM | POA: Diagnosis not present

## 2017-06-04 DIAGNOSIS — Z5181 Encounter for therapeutic drug level monitoring: Secondary | ICD-10-CM | POA: Diagnosis not present

## 2017-06-04 DIAGNOSIS — Z952 Presence of prosthetic heart valve: Secondary | ICD-10-CM | POA: Diagnosis not present

## 2017-06-04 LAB — POCT INR: INR: 2.6

## 2017-06-04 NOTE — Patient Instructions (Signed)
Description   Continue taking 1 tablet daily except 1/2 tablet on Fridays. Recheck INR in 4 weeks. Coumadin Clinic [phone]

## 2017-06-29 ENCOUNTER — Other Ambulatory Visit: Payer: Self-pay | Admitting: Internal Medicine

## 2017-06-29 NOTE — Telephone Encounter (Signed)
Please review for refill. Thanks!  

## 2017-07-02 ENCOUNTER — Ambulatory Visit (INDEPENDENT_AMBULATORY_CARE_PROVIDER_SITE_OTHER): Payer: Medicare Other | Admitting: *Deleted

## 2017-07-02 ENCOUNTER — Encounter (INDEPENDENT_AMBULATORY_CARE_PROVIDER_SITE_OTHER): Payer: Self-pay

## 2017-07-02 ENCOUNTER — Encounter: Payer: Self-pay | Admitting: Internal Medicine

## 2017-07-02 ENCOUNTER — Ambulatory Visit: Payer: Medicare Other | Admitting: Internal Medicine

## 2017-07-02 VITALS — BP 126/78 | HR 76 | Ht 63.0 in | Wt 131.0 lb

## 2017-07-02 DIAGNOSIS — I4891 Unspecified atrial fibrillation: Secondary | ICD-10-CM

## 2017-07-02 DIAGNOSIS — Z950 Presence of cardiac pacemaker: Secondary | ICD-10-CM

## 2017-07-02 DIAGNOSIS — I442 Atrioventricular block, complete: Secondary | ICD-10-CM

## 2017-07-02 DIAGNOSIS — Z5181 Encounter for therapeutic drug level monitoring: Secondary | ICD-10-CM

## 2017-07-02 DIAGNOSIS — Z952 Presence of prosthetic heart valve: Secondary | ICD-10-CM

## 2017-07-02 DIAGNOSIS — I6389 Other cerebral infarction: Secondary | ICD-10-CM

## 2017-07-02 LAB — CUP PACEART INCLINIC DEVICE CHECK
Battery Impedance: 1247.3 Ohm
Date Time Interrogation Session: 20190114112804
Implantable Lead Implant Date: 20120120
Implantable Lead Location: 753859
Implantable Pulse Generator Implant Date: 20120120
Lead Channel Impedance Value: 573.35 Ohm
Lead Channel Pacing Threshold Amplitude: 0.75 V
Lead Channel Pacing Threshold Pulse Width: 0.35 ms
MDC IDC LEAD IMPLANT DT: 20120120
MDC IDC LEAD LOCATION: 753860
MDC IDC SET LEADCHNL RV PACING AMPLITUDE: 2.5 V
MDC IDC SET LEADCHNL RV PACING PULSEWIDTH: 0.35 ms
MDC IDC SET LEADCHNL RV SENSING SENSITIVITY: 5 mV
MDC IDC STAT BRADY RV PERCENT PACED: 100 %

## 2017-07-02 LAB — POCT INR: INR: 2.4

## 2017-07-02 MED ORDER — WARFARIN SODIUM 7.5 MG PO TABS: 7.5000 mg | ORAL_TABLET | ORAL | 1 refills | Status: DC

## 2017-07-02 NOTE — Patient Instructions (Signed)
Description   Today take 1.5 tablets then continue taking 1 tablet daily except 1/2 tablet on Fridays. Recheck INR in 4 weeks. Coumadin Clinic [phone]

## 2017-07-02 NOTE — Patient Instructions (Signed)
Medication Instructions:  Your physician recommends that you continue on your current medications as directed. Please refer to the Current Medication list given to you today.  Labwork: None ordered.  Testing/Procedures: None ordered.  Follow-Up: You will follow up with the device clinic in 6 months to have your Sorin device interrogated.  Your physician wants you to follow-up in: one year with Dr. Ladona Ridgel.  You will receive a reminder letter in the mail two months in advance. If you don't receive a letter, please call our office to schedule the follow-up appointment.   Any Other Special Instructions Will Be Listed Below (If Applicable).  If you need a refill on your cardiac medications before your next appointment, please call your pharmacy.

## 2017-07-02 NOTE — Progress Notes (Signed)
HPI Pamela Walker returns today for ongoing evaluation and management of CHB, s/p PPM, atrial fib and prior MVR. She is a pleasant 72 yo woman with the above problems who I saw initially several months ago. She has done well in the interim although she does need to have some dental work done. No chest pain or sob. She is active and walks regularly.  No Known Allergies   Current Outpatient Medications  Medication Sig Dispense Refill  . amoxicillin (AMOXIL) 500 MG tablet Take 4 tablets (2 g) 30-60 minutes prior to dental appointment/prodedure 4 tablet 3  . aspirin EC 81 MG tablet Take 1 tablet (81 mg total) by mouth daily.    Marland Kitchen atorvastatin (LIPITOR) 40 MG tablet Take 1 tablet by mouth daily.    . carvedilol (COREG) 6.25 MG tablet Take 1 tablet (6.25 mg total) by mouth 2 (two) times daily. 180 tablet 1  . levETIRAcetam (KEPPRA) 500 MG tablet Take 500 mg by mouth 2 (two) times daily.    Marland Kitchen lisinopril (PRINIVIL,ZESTRIL) 5 MG tablet TAKE 1 TABLET BY MOUTH  DAILY 90 tablet 2  . metFORMIN (GLUCOPHAGE-XR) 500 MG 24 hr tablet Take 500 mg by mouth daily.    Marland Kitchen spironolactone (ALDACTONE) 25 MG tablet Take 25 mg by mouth as directed. MWF    . warfarin (COUMADIN) 7.5 MG tablet Take 1 tablet (7.5 mg total) by mouth as directed. 90 tablet 1  . zolpidem (AMBIEN) 10 MG tablet Take 10 mg by mouth at bedtime.     No current facility-administered medications for this visit.      Past Medical History:  Diagnosis Date  . Atrial fibrillation (HCC)   . Hyperlipidemia   . Rheumatic fever/heart disease   . Seizure (HCC)   . Stroke (HCC)     ROS:   All systems reviewed and negative except as noted in the HPI.   Past Surgical History:  Procedure Laterality Date  . APPENDECTOMY    . CESAREAN SECTION    . INSERT / REPLACE / REMOVE PACEMAKER    . MITRAL VALVE REPLACEMENT       Family History  Problem Relation Age of Onset  . Heart disease Mother        angina  . Arrhythmia Sister    Pacer     Social History   Socioeconomic History  . Marital status: Married    Spouse name: Not on file  . Number of children: Not on file  . Years of education: Not on file  . Highest education level: Not on file  Social Needs  . Financial resource strain: Not on file  . Food insecurity - worry: Not on file  . Food insecurity - inability: Not on file  . Transportation needs - medical: Not on file  . Transportation needs - non-medical: Not on file  Occupational History  . Not on file  Tobacco Use  . Smoking status: Former Smoker    Packs/day: 2.00    Years: 10.00    Pack years: 20.00    Types: Cigarettes    Last attempt to quit: 1980    Years since quitting: 39.0  . Smokeless tobacco: Never Used  Substance and Sexual Activity  . Alcohol use: No    Alcohol/week: 0.0 oz    Comment: 1 glass of wine every few months.  . Drug use: No  . Sexual activity: Not on file  Other Topics Concern  . Not on file  Social  History Narrative  . Not on file     BP 126/78   Pulse 76   Ht 5\' 3"  (1.6 m)   Wt 131 lb (59.4 kg)   BMI 23.21 kg/m   Physical Exam:  Well appearing 72 yo woman, NAD HEENT: Unremarkable Neck:  6 cm JVD, no thyromegally Lymphatics:  No adenopathy Back:  No CVA tenderness Lungs:  Clear with no wheezes HEART:  Regular rate rhythm, no murmurs, no rubs, no clicks, mechanical S1. Abd:  soft, positive bowel sounds, no organomegally, no rebound, no guarding Ext:  2 plus pulses, no edema, no cyanosis, no clubbing Skin:  No rashes no nodules Neuro:  CN II through XII intact, motor grossly intact   DEVICE  Normal device function.  See PaceArt for details.   Assess/Plan: 1. Atrial fib - her ventricular rate is well controlled. Will follow. 2. CHB - she is asymptomatic, s/p PPM 3. PPM - her sorin single chamber PPM is interrogated today under my direction and is working normally. She has greater than 3 years of battery longevity.  Pamela Walker.D.

## 2017-07-03 ENCOUNTER — Encounter: Payer: Self-pay | Admitting: Internal Medicine

## 2017-07-30 ENCOUNTER — Ambulatory Visit (INDEPENDENT_AMBULATORY_CARE_PROVIDER_SITE_OTHER): Payer: Medicare Other | Admitting: *Deleted

## 2017-07-30 DIAGNOSIS — Z5181 Encounter for therapeutic drug level monitoring: Secondary | ICD-10-CM | POA: Diagnosis not present

## 2017-07-30 DIAGNOSIS — I4891 Unspecified atrial fibrillation: Secondary | ICD-10-CM

## 2017-07-30 DIAGNOSIS — I6389 Other cerebral infarction: Secondary | ICD-10-CM

## 2017-07-30 DIAGNOSIS — Z952 Presence of prosthetic heart valve: Secondary | ICD-10-CM

## 2017-07-30 LAB — POCT INR: INR: 2.4

## 2017-07-30 NOTE — Patient Instructions (Addendum)
08/02/17: Last dose of Coumadin.  08/03/17: No Coumadin or Lovenox.  08/04/17: Inject Lovenox 60mg  in the fatty abdominal tissue at least 2 inches from the belly button twice a day about 12 hours apart, 8am and 8pm rotate sites. No Coumadin.  08/05/17: Inject Lovenox in the fatty tissue every 12 hours, 8am and 8pm. No Coumadin.  08/06/17: Inject Lovenox in the fatty tissue every 12 hours, 8am and 8pm. No Coumadin.  08/07/17: Inject Lovenox in the fatty tissue in the morning at 8 am (No PM dose). No Coumadin.  08/08/17: Procedure Day - No Lovenox - Resume Coumadin in the evening or as directed by doctor (take an extra half tablet with usual dose for 2 days then resume normal dose).  08/09/17: Resume Lovenox inject in the fatty tissue 8am and 8pm and take Coumadin.  08/10/17: Inject Lovenox in the fatty tissue at 8am and 8pm and take Coumadin.  08/11/17: Inject Lovenox in the fatty tissue at 8am and 8pm and take Coumadin.  08/12/17: Inject Lovenox in the fatty tissue at 8am and 8pm and take Coumadin.  08/13/17: Inject Lovenox in the fatty tissue at 8am and 8pm and take Coumadin.  08/14/17: Inject Lovenox in the fatty tissue at 8am and 8pm and take Coumadin.  08/15/17: Inject Lovenox in the fatty tissue at 8am & report to Coumadin appt to check INR.  Description   Today take 1.5 tablets then start taking 1 tablet daily. Recheck INR in 1 week after procedure. Coumadin Clinic [phone]

## 2017-08-01 ENCOUNTER — Telehealth: Payer: Self-pay

## 2017-08-01 MED ORDER — ENOXAPARIN SODIUM 60 MG/0.6ML ~~LOC~~ SOLN: 60.0000 mg | Freq: Two times a day (BID) | SUBCUTANEOUS | 1 refills | Status: DC

## 2017-08-01 NOTE — Progress Notes (Signed)
08/02/17: Last dose of Coumadin.  08/03/17: No Coumadin or Lovenox.  08/04/17: Inject Lovenox 60mg  in the fatty abdominal tissue at least 2 inches from the belly button twice a day about 12 hours apart, 8am and 8pm rotate sites. No Coumadin.  08/05/17: Inject Lovenox in the fatty tissue every 12 hours, 8am and 8pm. No Coumadin.  08/06/17: Inject Lovenox in the fatty tissue every 12 hours, 8am and 8pm. No Coumadin.  08/07/17: Inject Lovenox in the fatty tissue in the morning at 8 am (No PM dose). No Coumadin.  08/08/17: Procedure Day - No Lovenox - Resume Coumadin in the evening or as directed by doctor (take an extra half tablet with usual dose for 2 days then resume normal dose).  08/09/17: Resume Lovenox inject in the fatty tissue 8am and 8pm and take Coumadin.  08/10/17: Inject Lovenox in the fatty tissue at 8am and 8pm and take Coumadin.  08/11/17: Inject Lovenox in the fatty tissue at 8am and 8pm and take Coumadin.  08/12/17: Inject Lovenox in the fatty tissue at 8am and 8pm and take Coumadin.  08/13/17: Inject Lovenox in the fatty tissue at 8am and 8pm and take Coumadin.  08/14/17: Inject Lovenox in the fatty tissue at 8am and 8pm and take Coumadin.  08/15/17: Inject Lovenox in the fatty tissue at 8am & report to Coumadin appt to check INR.

## 2017-08-01 NOTE — Telephone Encounter (Signed)
   [facility] Pre-operative Risk Assessment    Request for surgical clearance:  1. What type of surgery is being performed? Not listed   2. When is this surgery scheduled? 08/08/2017  3. What type of clearance is required (medical clearance vs. Pharmacy clearance to hold med vs. Both)? Pharmacy clearance to hold med   4. Are there any medications that need to be held prior to surgery and how long? Warfarin: 5 days prior only Stop Date: 08/03/2017  5. Practice name and name of physician performing surgery? [facility] - Dola Argyle, MD   6. What is your office phone and fax number? Phone #: [phone]  Fax [phone], Any questions call Ernestene Mention, ext: [phone]  7. Anesthesia type (None, local, MAC, general) ? None listed    Pamela Walker 08/01/2017, 12:53 PM  _________________________________________________________________   ()

## 2017-08-01 NOTE — Telephone Encounter (Signed)
Informed by Coumadin staff that clearance needs to be addressed today for procedure on 2/20 due to need for Lovenox bridging. Pt takes Coumadin for afib with hx of CVA and mechanical mitral valve replacement. Patient is having a colonoscopy which is not listed on the initial clearance request. We will arrange for Lovenox bridging since patient will need to hold her Coumadin for 5 days prior.

## 2017-08-02 NOTE — Telephone Encounter (Signed)
Please let requesting provider know about need to appointment.

## 2017-08-03 NOTE — Telephone Encounter (Signed)
   Primary Cardiologist: Yvonne Kendall, MD  Chart reviewed as part of pre-operative protocol coverage. Patient was contacted 08/03/2017 in reference to pre-operative risk assessment for pending surgery as outlined below.  Pamela Walker was last seen on 03/09/2017 by Dr. Okey Dupre and 07/02/2017 by Dr. Ladona Ridgel. Since that day, Pamela Walker has done well.  Therefore, based on ACC/AHA guidelines, the patient would be at acceptable risk for the planned procedure without further cardiovascular testing.   Additionally, she has been contacted by the pharmacist and she has gotten the Lovenox.  She is aware of when to start using it and when to hold the Coumadin.  I will route this recommendation to the requesting party via Epic fax function and remove from pre-op pool.  Please call with questions.  Theodore Demark, PA-C 08/03/2017, 2:19 PM

## 2017-08-03 NOTE — Telephone Encounter (Signed)
R Barrett, PA faxed encounter to Dr Mordecai Maes via Epic.

## 2017-08-14 ENCOUNTER — Encounter (HOSPITAL_COMMUNITY): Payer: Self-pay | Admitting: Emergency Medicine

## 2017-08-14 ENCOUNTER — Emergency Department (HOSPITAL_COMMUNITY): Payer: Medicare Other

## 2017-08-14 ENCOUNTER — Other Ambulatory Visit: Payer: Self-pay

## 2017-08-14 ENCOUNTER — Emergency Department (HOSPITAL_COMMUNITY)
Admission: EM | Admit: 2017-08-14 | Discharge: 2017-08-15 | Disposition: A | Discharge status: Home / Self Care | Payer: Medicare Other | Attending: Emergency Medicine | Admitting: Emergency Medicine

## 2017-08-14 DIAGNOSIS — Z79899 Other long term (current) drug therapy: Secondary | ICD-10-CM | POA: Insufficient documentation

## 2017-08-14 DIAGNOSIS — Z7901 Long term (current) use of anticoagulants: Secondary | ICD-10-CM | POA: Diagnosis not present

## 2017-08-14 DIAGNOSIS — Z87891 Personal history of nicotine dependence: Secondary | ICD-10-CM | POA: Insufficient documentation

## 2017-08-14 DIAGNOSIS — Z7982 Long term (current) use of aspirin: Secondary | ICD-10-CM | POA: Insufficient documentation

## 2017-08-14 DIAGNOSIS — M79605 Pain in left leg: Secondary | ICD-10-CM | POA: Diagnosis not present

## 2017-08-14 DIAGNOSIS — Z7984 Long term (current) use of oral hypoglycemic drugs: Secondary | ICD-10-CM | POA: Diagnosis not present

## 2017-08-14 DIAGNOSIS — M7552 Bursitis of left shoulder: Secondary | ICD-10-CM | POA: Insufficient documentation

## 2017-08-14 DIAGNOSIS — M25512 Pain in left shoulder: Secondary | ICD-10-CM | POA: Diagnosis present

## 2017-08-14 LAB — CBC
HCT: 32.2 % — ABNORMAL LOW (ref 36.0–46.0)
Hemoglobin: 10.6 g/dL — ABNORMAL LOW (ref 12.0–15.0)
MCH: 30.2 pg (ref 26.0–34.0)
MCHC: 32.9 g/dL (ref 30.0–36.0)
MCV: 91.7 fL (ref 78.0–100.0)
PLATELETS: 164 10*3/uL (ref 150–400)
RBC: 3.51 MIL/uL — AB (ref 3.87–5.11)
RDW: 15 % (ref 11.5–15.5)
WBC: 5 10*3/uL (ref 4.0–10.5)

## 2017-08-14 LAB — BASIC METABOLIC PANEL
Anion gap: 9 (ref 5–15)
BUN: 13 mg/dL (ref 6–20)
CALCIUM: 9.1 mg/dL (ref 8.9–10.3)
CHLORIDE: 98 mmol/L — AB (ref 101–111)
CO2: 25 mmol/L (ref 22–32)
CREATININE: 0.79 mg/dL (ref 0.44–1.00)
GFR calc non Af Amer: >60 mL/min (ref >60)
GLUCOSE: 202 mg/dL — AB (ref 65–99)
Potassium: 3.8 mmol/L (ref 3.5–5.1)
Sodium: 132 mmol/L — ABNORMAL LOW (ref 135–145)

## 2017-08-14 LAB — I-STAT TROPONIN, ED: TROPONIN I, POC: 0 ng/mL (ref 0.00–0.08)

## 2017-08-14 NOTE — ED Notes (Signed)
EKG done in triage, did not cross over. Hard copy available in triage

## 2017-08-14 NOTE — ED Triage Notes (Signed)
Pt reports L sided neck pain present since last Wednesday with radiation into L shoulder. Also reports R groin pain. States pain has been present since after having colonoscopy. Pt does have significant cardiac hx.

## 2017-08-15 ENCOUNTER — Ambulatory Visit (INDEPENDENT_AMBULATORY_CARE_PROVIDER_SITE_OTHER): Payer: Medicare Other | Admitting: *Deleted

## 2017-08-15 DIAGNOSIS — I4891 Unspecified atrial fibrillation: Secondary | ICD-10-CM

## 2017-08-15 DIAGNOSIS — Z952 Presence of prosthetic heart valve: Secondary | ICD-10-CM

## 2017-08-15 DIAGNOSIS — Z5181 Encounter for therapeutic drug level monitoring: Secondary | ICD-10-CM | POA: Diagnosis not present

## 2017-08-15 DIAGNOSIS — I6389 Other cerebral infarction: Secondary | ICD-10-CM | POA: Diagnosis not present

## 2017-08-15 LAB — POCT INR: INR: 2.2

## 2017-08-15 MED ORDER — TRAMADOL HCL 50 MG PO TABS: 50.0000 mg | ORAL_TABLET | Freq: Four times a day (QID) | ORAL | 0 refills | Status: DC | PRN

## 2017-08-15 MED ORDER — ENOXAPARIN SODIUM 60 MG/0.6ML ~~LOC~~ SOLN: 60.0000 mg | Freq: Two times a day (BID) | SUBCUTANEOUS | 0 refills | Status: DC

## 2017-08-15 NOTE — ED Provider Notes (Signed)
[facility] EMERGENCY DEPARTMENT Provider Note   CSN: [id number] Arrival date & time: 08/14/17  4098     History   Chief Complaint Chief Complaint  Patient presents with  . Neck Pain    HPI Pamela Walker is a 72 y.o. female.  Presents to the emergency department for evaluation of neck and shoulder pain.  Patient reports severe pain in the left shoulder that has been ongoing for 1 week.  Pain goes up into the left side of the neck as well.  She denies injury.  The area is very tender to the touch.  She cannot move her left arm at all because it causes severe pain.  No numbness, tingling or weakness of the upper extremities.  Also complaining of pain in her right upper thigh.  Patient had a colonoscopy 10 days ago.  She was taken off of her Coumadin at that time and is currently on Coumadin and Lovenox.  She is on Coumadin because of atrial fibrillation, has never had a blood clot.  She is concerned, however, that she has a DVT.  No chest pain, shortness of breath, palpitations.      Past Medical History:  Diagnosis Date  . Atrial fibrillation (HCC)   . Hyperlipidemia   . Rheumatic fever/heart disease   . Seizure (HCC)   . Stroke [facility])     Patient Active Problem List   Diagnosis Date Noted  . Nonischemic cardiomyopathy (HCC) 03/09/2017  . Rheumatic heart disease 03/09/2017  . Complete heart block (HCC) 03/09/2017  . Encounter for therapeutic drug monitoring 06/29/2016  . Atrial fibrillation (HCC) [I48.91] 06/29/2016  . CVA (cerebral vascular accident) (HCC) 06/29/2016  . 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 APPENDECTOMY    . CESAREAN SECTION    . INSERT / REPLACE / REMOVE PACEMAKER    . MITRAL VALVE REPLACEMENT      OB History    No data available       Home Medications    Prior to Admission medications   Medication Sig Start Date End Date Taking? Authorizing Provider  amoxicillin  (AMOXIL) 500 MG tablet Take 4 tablets (2 g) 30-60 minutes prior to dental appointment/prodedure 03/09/17   End, Cristal Deer, MD  aspirin EC 81 MG tablet Take 1 tablet (81 mg total) by mouth daily. 04/02/17   End, Cristal Deer, MD  atorvastatin (LIPITOR) 40 MG tablet Take 1 tablet by mouth daily. 11/02/16   [provider]  carvedilol (COREG) 6.25 MG tablet Take 1 tablet (6.25 mg total) by mouth 2 (two) times daily. 03/09/17   End, Cristal Deer, MD  enoxaparin (LOVENOX) 60 MG/0.6ML injection Inject 0.6 mLs (60 mg total) into the skin every 12 (twelve) hours. 08/01/17   Marinus Maw, MD  levETIRAcetam (KEPPRA) 500 MG tablet Take 500 mg by mouth 2 (two) times daily. 04/20/16   [provider]  lisinopril (PRINIVIL,ZESTRIL) 5 MG tablet TAKE 1 TABLET BY MOUTH  DAILY 06/29/17   End, Cristal Deer, MD  metFORMIN (GLUCOPHAGE-XR) 500 MG 24 hr tablet Take 500 mg by mouth daily. 06/19/16   [provider]  spironolactone (ALDACTONE) 25 MG tablet Take 25 mg by mouth as directed. MWF    [provider]  traMADol (ULTRAM) 50 MG tablet Take 1 tablet (50 mg total) by mouth every 6 (six) hours as needed for moderate pain. 08/15/17   Gilda Crease, MD  warfarin (COUMADIN) 7.5 MG tablet Take 1  tablet (7.5 mg total) by mouth as directed. 07/02/17   Marinus Maw, MD  zolpidem (AMBIEN) 10 MG tablet Take 10 mg by mouth at bedtime.    [provider]    Family History Family History  Problem Relation Age of Onset  . Heart disease Mother        angina  . Arrhythmia Sister        Pacer    Social History Social History   Tobacco Use  . Smoking status: Former Smoker    Packs/day: 2.00    Years: 10.00    Pack years: 20.00    Types: Cigarettes    Last attempt to quit: 1980    Years since quitting: 39.1  . Smokeless tobacco: Never Used  Substance Use Topics  . Alcohol use: No    Alcohol/week: 0.0 oz    Comment: 1 glass of wine every few months.  . Drug use: No      Allergies   Patient has no known allergies.   Review of Systems Review of Systems  Respiratory: Negative for shortness of breath.   Cardiovascular: Negative for chest pain.  Musculoskeletal: Positive for arthralgias.  All other systems reviewed and are negative.    Physical Exam Updated Vital Signs BP 119/70   Pulse 77   Temp 99.7 F (37.6 C) (Oral)   Resp 15   Ht 5\' 3"  (1.6 m)   Wt 59 kg (130 lb)   SpO2 99%   BMI 23.03 kg/m   Physical Exam  Constitutional: She is oriented to person, place, and time. She appears well-developed and well-nourished. No distress.  HENT:  Head: Normocephalic and atraumatic.  Right Ear: Hearing normal.  Left Ear: Hearing normal.  Nose: Nose normal.  Mouth/Throat: Oropharynx is clear and moist and mucous membranes are normal.  Eyes: Conjunctivae and EOM are normal. Pupils are equal, round, and reactive to light.  Neck: Normal range of motion. Neck supple.  Cardiovascular: Regular rhythm, S1 normal and S2 normal. Exam reveals no gallop and no friction rub.  No murmur heard. Pulses:      Radial pulses are 2+ on the right side, and 2+ on the left side.  Pulmonary/Chest: Effort normal and breath sounds normal. No respiratory distress. She exhibits no tenderness.  Abdominal: Soft. Normal appearance and bowel sounds are normal. There is no hepatosplenomegaly. There is no tenderness. There is no rebound, no guarding, no tenderness at McBurney's point and negative Murphy's sign. No hernia.  Musculoskeletal:       Left shoulder: She exhibits decreased range of motion and tenderness. She exhibits no swelling and no deformity.       Right upper leg: She exhibits tenderness.       Legs: No circumferential swelling of the leg, no calf swelling, no venous cords  Neurological: She is alert and oriented to person, place, and time. She has normal strength. No cranial nerve deficit or sensory deficit. Coordination normal. GCS eye subscore is 4. GCS  verbal subscore is 5. GCS motor subscore is 6.  Skin: Skin is warm, dry and intact. No rash noted. No cyanosis.  Psychiatric: She has a normal mood and affect. Her speech is normal and behavior is normal. Thought content normal.  Nursing note and vitals reviewed.    ED Treatments / Results  Labs (all labs ordered are listed, but only abnormal results are displayed) Labs Reviewed  BASIC METABOLIC PANEL - Abnormal; Notable for the following components:  Result Value   Sodium 132 (*)    Chloride 98 (*)    Glucose, Bld 202 (*)    All other components within normal limits  CBC - Abnormal; Notable for the following components:   RBC 3.51 (*)    Hemoglobin 10.6 (*)    HCT 32.2 (*)    All other components within normal limits  I-STAT TROPONIN, ED    EKG  EKG Interpretation None       Radiology Dg Chest 2 View  Result Date: 08/14/2017 CLINICAL DATA:  Left neck pain, shoulder pain EXAM: CHEST  2 VIEW COMPARISON:  None. FINDINGS: Prior median sternotomy, CABG and valve replacement. Left pacer in place with leads in the right atrium and right ventricle. Cardiomegaly with vascular congestion. Rounded density projects over the posterior heart on the lateral view. This may represent an enlarged vessel. Cannot completely exclude pulmonary nodule/mass. Mild hyperinflation. No effusions or acute bony abnormality. IMPRESSION: Cardiomegaly. Rounded 3.4 cm density noted over the posterior heart border on the lateral view. This could be further evaluated with chest CT to exclude pulmonary mass/nodule. Electronically Signed   By: Charlett Nose M.D.   On: 08/14/2017 20:56    Procedures Procedures (including critical care time)  Medications Ordered in ED Medications - No data to display   Initial Impression / Assessment and Plan / ED Course  I have reviewed the triage vital signs and the nursing notes.  Pertinent labs & imaging results that were available during my care of the patient were  reviewed by me and considered in my medical decision making (see chart for details).     Patient does report previous cardiac history.  She is concerned because of the left shoulder pain.  This is clearly, however, musculoskeletal.  It has been ongoing for 1 week without change.  She does not have any abnormalities on her cardiac workup today.  Examination reveals exquisite tenderness in the subacromial bursa region and inability to move the shoulder because of severe pain.  He does not require any further workup for cardiac etiology as this is clearly musculoskeletal.  Patient also complaining of pain in the right upper thigh area.  This started after her colonoscopy 10 days ago.  She was not on blood thinner at that time, but takes her blood thinner because of a history of atrial fibrillation, not DVT.  She has been on Lovenox for 10 days and is on Coumadin as well.  She has an appointment tomorrow at the Coumadin clinic.  If she has a DVT, she is already appropriately treated.  Will schedule her for outpatient duplex study but no medication changes would be necessary.  Final Clinical Impressions(s) / ED Diagnoses   Final diagnoses:  Bursitis of left shoulder  Left leg pain    ED Discharge Orders        Ordered    traMADol (ULTRAM) 50 MG tablet  Every 6 hours PRN     08/15/17 0013       Gilda Crease, MD 08/15/17 [phone]

## 2017-08-15 NOTE — Patient Instructions (Addendum)
Description   Today take 1.5 tablets and take 1.5 tablets tomorrow then resume 1 tablet everyday. Continue taking Lovenox injection every 12 hours until appointment on Monday.  Recheck INR on Monday. Coumadin Clinic [phone]

## 2017-08-20 ENCOUNTER — Ambulatory Visit (INDEPENDENT_AMBULATORY_CARE_PROVIDER_SITE_OTHER): Payer: Medicare Other | Admitting: Pharmacist

## 2017-08-20 DIAGNOSIS — Z952 Presence of prosthetic heart valve: Secondary | ICD-10-CM

## 2017-08-20 DIAGNOSIS — Z5181 Encounter for therapeutic drug level monitoring: Secondary | ICD-10-CM

## 2017-08-20 LAB — POCT INR: INR: 3.3

## 2017-08-20 NOTE — Patient Instructions (Signed)
Description   Continue taking 1 tablet every day. Recheck INR in 3 weeks. [phone]     

## 2017-09-03 ENCOUNTER — Encounter: Payer: Self-pay | Admitting: Internal Medicine

## 2017-09-03 ENCOUNTER — Ambulatory Visit: Payer: Medicare Other | Admitting: Internal Medicine

## 2017-09-03 VITALS — BP 118/66 | HR 71 | Ht 63.0 in | Wt 127.2 lb

## 2017-09-03 DIAGNOSIS — M25512 Pain in left shoulder: Secondary | ICD-10-CM

## 2017-09-03 DIAGNOSIS — I4821 Permanent atrial fibrillation: Secondary | ICD-10-CM

## 2017-09-03 DIAGNOSIS — I442 Atrioventricular block, complete: Secondary | ICD-10-CM | POA: Diagnosis not present

## 2017-09-03 DIAGNOSIS — Z952 Presence of prosthetic heart valve: Secondary | ICD-10-CM

## 2017-09-03 DIAGNOSIS — I428 Other cardiomyopathies: Secondary | ICD-10-CM | POA: Diagnosis not present

## 2017-09-03 DIAGNOSIS — I099 Rheumatic heart disease, unspecified: Secondary | ICD-10-CM

## 2017-09-03 DIAGNOSIS — I482 Chronic atrial fibrillation: Secondary | ICD-10-CM

## 2017-09-03 MED ORDER — CARVEDILOL 6.25 MG PO TABS: 6.2500 mg | ORAL_TABLET | Freq: Two times a day (BID) | ORAL | 3 refills | Status: DC

## 2017-09-03 MED ORDER — LISINOPRIL 5 MG PO TABS: 5.0000 mg | ORAL_TABLET | Freq: Every day | ORAL | 2 refills | Status: AC

## 2017-09-03 NOTE — Patient Instructions (Signed)
Medication Instructions:  Your physician recommends that you continue on your current medications as directed. Please refer to the Current Medication list given to you today.  -- If you need a refill on your cardiac medications before your next appointment, please call your pharmacy. --  Labwork:  BMET (needs to be done in 3 months at a scheduled Coumadin clinic visit)  Testing/Procedures: None ordered  Follow-Up: Your physician wants you to follow-up in: 6 months with Dr. Okey Dupre.    You will receive a reminder letter in the mail two months in advance. If you don't receive a letter, please call our office to schedule the follow-up appointment.  Thank you for choosing [facility]!!    Any Other Special Instructions Will Be Listed Below (If Applicable).

## 2017-09-03 NOTE — Progress Notes (Signed)
Follow-up Outpatient Visit Date: 09/03/2017  Primary Care Provider: Elinor Dodge, MD [address] [place] [address]  Chief Complaint: Follow-up atrial fibrillation and valvular heart disease  HPI:  Pamela Walker is a 72 y.o. 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  I last saw Pamela Walker in September, at which time she was doing well.  She was seen in EP clinic by Dr. Ladona Ridgel in January, at which time she also reported feeling well.  Today, Pamela Walker only complaint is of left arm pain that began about 3 weeks ago.  She denies trauma to the area, noting only that she frequently rests her head on the left arm at night when she is sleeping.  It is gradually getting better.  She has not seen an orthopedist.  Pamela Walker denies chest pain, shortness of breath, palpitations, lightheadedness, orthopnea, PND, and edema.  She walks some around her house and at the mall, but does not exercise regularly.  She remains on warfarin without bleeding.  She underwent colonoscopy with successful Lovenox bridge earlier this year.  --------------------------------------------------------------------------------------------------  Cardiovascular History & Procedures: Cardiovascular Problems:  Mitral stenosis status post MVR x 2 (first MVR complicated by endocarditis)  Atrial fibrillation  Complete heart block status post pacemaker  Stroke  Risk Factors:  Hypertension, hyperlipidemia, and age > 27  Cath/PCI:  None available  CV Surgery:  Mitral valve replacement (1984 and 2003)  EP Procedures and Devices:  Pacemaker (Sorin)  Non-Invasive Evaluation(s):  Limited TTE (03/13/16): Normal LV size and function (EF 60%). No obvious  vegetation.  TTE (01/24/16): Normal LV size with LVEF 45-50% and global hypokinesis. Left atrium severely dilated. Normal RV size and function. Mitral valve prosthesis with trace regurgitation. Pacing wire noted in right heart.  Recent CV Pertinent Labs: Lab Results  Component Value Date   INR 3.3 08/20/2017   INR 8.4 (>) 10/30/2016   K 3.8 08/14/2017   BUN 13 08/14/2017   CREATININE 0.79 08/14/2017    Past medical and surgical history were reviewed and updated in EPIC.  Current Meds  Medication Sig  . amoxicillin (AMOXIL) 500 MG tablet Take 4 tablets (2 g) 30-60 minutes prior to dental appointment/prodedure  . aspirin EC 81 MG tablet Take 1 tablet (81 mg total) by mouth daily.  Marland Kitchen atorvastatin (LIPITOR) 40 MG tablet Take 1 tablet by mouth daily.  . carvedilol (COREG) 6.25 MG tablet Take 1 tablet (6.25 mg total) by mouth 2 (two) times daily.  Marland Kitchen levETIRAcetam (KEPPRA) 500 MG tablet Take 500 mg by mouth 2 (two) times daily.  Marland Kitchen lisinopril (PRINIVIL,ZESTRIL) 5 MG tablet Take 1 tablet (5 mg total) by mouth daily.  . metFORMIN (GLUCOPHAGE-XR) 500 MG 24 hr tablet Take 500 mg by mouth daily.  Marland Kitchen spironolactone (ALDACTONE) 25 MG tablet Take 25 mg by mouth as directed. MWF  . traMADol (ULTRAM) 50 MG tablet Take 1 tablet (50 mg total) by mouth every 6 (six) hours as needed for moderate pain.  Marland Kitchen warfarin (COUMADIN) 7.5 MG tablet Take 1 tablet (7.5 mg total) by mouth as directed.  . zolpidem (AMBIEN) 10 MG tablet Take 10 mg by mouth at bedtime.  . [DISCONTINUED] carvedilol (COREG) 6.25 MG tablet Take 1 tablet (6.25 mg total) by mouth 2 (two) times daily.  . [DISCONTINUED] lisinopril (PRINIVIL,ZESTRIL)  5 MG tablet TAKE 1 TABLET BY MOUTH  DAILY    Allergies: Patient has no known allergies.  Social History   Socioeconomic History  . Marital status: Married    Spouse name: Not on file  . Number of children: Not on file  . Years of education: Not on file  . Highest education level: Not on  file  Social Needs  . Financial resource strain: Not on file  . Food insecurity - worry: Not on file  . Food insecurity - inability: Not on file  . Transportation needs - medical: Not on file  . Transportation needs - non-medical: Not on file  Occupational History  . Not on file  Tobacco Use  . Smoking status: Former Smoker    Packs/day: 2.00    Years: 10.00    Pack years: 20.00    Types: Cigarettes    Last attempt to quit: 1980    Years since quitting: 39.2  . Smokeless tobacco: Never Used  Substance and Sexual Activity  . Alcohol use: No    Alcohol/week: 0.0 oz    Comment: 1 glass of wine every few months.  . Drug use: No  . Sexual activity: Not on file  Other Topics Concern  . Not on file  Social History Narrative  . Not on file    Family History  Problem Relation Age of Onset  . Heart disease Mother        angina  . Arrhythmia Sister        Pacer    Review of Systems: Patient notes occasional constipation.  Otherwise, a 12-system review of systems was performed and was negative except as noted in the HPI.  --------------------------------------------------------------------------------------------------  Physical Exam: BP 118/66   Pulse 71   Ht 5\' 3"  (1.6 m)   Wt 127 lb 3.2 oz (57.7 kg)   BMI 22.53 kg/m   General: No acute distress.  Accompanied by her husband. HEENT: No conjunctival pallor or scleral icterus. Moist mucous membranes.  OP clear. Neck: Supple without lymphadenopathy, thyromegaly, JVD, or HJR. No carotid bruit. Lungs: Normal work of breathing. Clear to auscultation bilaterally without wheezes or crackles. Heart: Regular rate and rhythm with 1/6 systolic murmur loudest at the left lower sternal border.  Crisp S1 and S2.  No rubs or gallops.  Nondisplaced PMI. Abd: Bowel sounds present. Soft, NT/ND without hepatosplenomegaly Ext: No lower extremity edema. Radial, PT, and DP pulses are 2+ bilaterally.  Patient is unable to abduct left shoulder  greater than 90 degrees due to pain. Skin: Warm and dry without rash.  EKG: Atrial fibrillation with a ventricularly paced rhythm and occasional PVCs versus aberrant conduction.  Lab Results  Component Value Date   WBC 5.0 08/14/2017   HGB 10.6 (L) 08/14/2017   HCT 32.2 (L) 08/14/2017   MCV 91.7 08/14/2017   PLT 164 08/14/2017    Lab Results  Component Value Date   NA 132 (L) 08/14/2017   K 3.8 08/14/2017   CL 98 (L) 08/14/2017   CO2 25 08/14/2017   BUN 13 08/14/2017   CREATININE 0.79 08/14/2017   GLUCOSE 202 (H) 08/14/2017    No results found for: CHOL, HDL, LDLCALC, LDLDIRECT, TRIG, CHOLHDL  --------------------------------------------------------------------------------------------------  ASSESSMENT AND PLAN: Permanent atrial fibrillation Pamela Walker is doing well and is predominantly ventricularly paced given atrial fibrillation with complete heart block.  We will continue with indefinite warfarin.  Pamela Walker should continue to follow-up with electrophysiology as previously arranged.  Rheumatic heart  disease status post bioprosthetic mitral valve replacement Pamela Walker is doing well and appears euvolemic on exam.  Continue aspirin and warfarin, given history of stroke.  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  Nonischemic cardiomyopathy Pamela Walker appears euvolemic with NYHA class II symptoms.  I encouraged her to increase her activity.  Recent labs were within normal limits.  We will continue current doses of carvedilol, lisinopril, and spironolactone.  We will plan to repeat a basic metabolic panel in about 3 months, given ongoing spironolactone and lisinopril use.  Complete heart block status post pacemaker Patient remains asymptomatic.  Continue follow-up in the device clinic.  Left shoulder pain Unclear etiology.  No report of  trauma.  I suggested that Pamela Walker follow-up with her PCP and consider orthopedic/sports medicine evaluation if symptoms do not improve.  Follow-up: Return to clinic in 6 months.  Yvonne Kendall, MD 09/03/2017 12:50 PM

## 2017-09-10 ENCOUNTER — Ambulatory Visit: Payer: Medicare Other | Admitting: *Deleted

## 2017-09-10 DIAGNOSIS — Z952 Presence of prosthetic heart valve: Secondary | ICD-10-CM | POA: Diagnosis not present

## 2017-09-10 DIAGNOSIS — I482 Chronic atrial fibrillation: Secondary | ICD-10-CM

## 2017-09-10 DIAGNOSIS — I4821 Permanent atrial fibrillation: Secondary | ICD-10-CM

## 2017-09-10 DIAGNOSIS — Z5181 Encounter for therapeutic drug level monitoring: Secondary | ICD-10-CM

## 2017-09-10 LAB — POCT INR: INR: 3.6

## 2017-09-10 NOTE — Patient Instructions (Signed)
Description   Today take 1/2 tablet then continue taking 1 tablet everyday. Recheck INR in 3 weeks. [phone]

## 2017-10-01 ENCOUNTER — Ambulatory Visit: Payer: Medicare Other | Admitting: *Deleted

## 2017-10-01 DIAGNOSIS — I482 Chronic atrial fibrillation: Secondary | ICD-10-CM | POA: Diagnosis not present

## 2017-10-01 DIAGNOSIS — Z5181 Encounter for therapeutic drug level monitoring: Secondary | ICD-10-CM | POA: Diagnosis not present

## 2017-10-01 DIAGNOSIS — Z952 Presence of prosthetic heart valve: Secondary | ICD-10-CM

## 2017-10-01 DIAGNOSIS — I4821 Permanent atrial fibrillation: Secondary | ICD-10-CM

## 2017-10-01 LAB — POCT INR: INR: 2.8

## 2017-10-01 NOTE — Patient Instructions (Signed)
Description   Continue taking 1 tablet everyday. Recheck INR in 4 weeks. [phone]

## 2017-10-29 ENCOUNTER — Ambulatory Visit: Payer: Medicare Other | Admitting: Pharmacist

## 2017-10-29 DIAGNOSIS — Z5181 Encounter for therapeutic drug level monitoring: Secondary | ICD-10-CM | POA: Diagnosis not present

## 2017-10-29 DIAGNOSIS — Z952 Presence of prosthetic heart valve: Secondary | ICD-10-CM | POA: Diagnosis not present

## 2017-10-29 LAB — POCT INR: INR: 1.3

## 2017-10-29 MED ORDER — WARFARIN SODIUM 7.5 MG PO TABS: 7.5000 mg | ORAL_TABLET | ORAL | 1 refills | Status: DC

## 2017-10-29 NOTE — Patient Instructions (Signed)
Description   Take 2 tablets today, 1.5 tablets, and 1.5 tablets on Wednesday, then continue taking 1 tablet every day. Recheck INR in 1 week. [phone]

## 2017-11-05 ENCOUNTER — Ambulatory Visit: Payer: Medicare Other | Admitting: *Deleted

## 2017-11-05 DIAGNOSIS — Z5181 Encounter for therapeutic drug level monitoring: Secondary | ICD-10-CM

## 2017-11-05 DIAGNOSIS — Z952 Presence of prosthetic heart valve: Secondary | ICD-10-CM | POA: Diagnosis not present

## 2017-11-05 DIAGNOSIS — I4891 Unspecified atrial fibrillation: Secondary | ICD-10-CM | POA: Diagnosis not present

## 2017-11-05 LAB — POCT INR: INR: 3

## 2017-11-05 NOTE — Patient Instructions (Signed)
Description   Continue taking 1 tablet every day. Recheck INR in 2 weeks. [phone]

## 2017-11-19 ENCOUNTER — Ambulatory Visit: Payer: Medicare Other | Admitting: *Deleted

## 2017-11-19 DIAGNOSIS — Z952 Presence of prosthetic heart valve: Secondary | ICD-10-CM | POA: Diagnosis not present

## 2017-11-19 DIAGNOSIS — Z5181 Encounter for therapeutic drug level monitoring: Secondary | ICD-10-CM

## 2017-11-19 DIAGNOSIS — I4891 Unspecified atrial fibrillation: Secondary | ICD-10-CM

## 2017-11-19 LAB — POCT INR: INR: 2.7 (ref 2.0–3.0)

## 2017-11-19 MED ORDER — WARFARIN SODIUM 7.5 MG PO TABS: 7.5000 mg | ORAL_TABLET | ORAL | 1 refills | Status: DC

## 2017-11-19 NOTE — Patient Instructions (Signed)
Description   Continue taking 1 tablet every day. Recheck INR in 3 weeks. [phone]

## 2017-12-10 ENCOUNTER — Ambulatory Visit: Payer: Medicare Other | Admitting: *Deleted

## 2017-12-10 ENCOUNTER — Other Ambulatory Visit: Payer: Medicare Other | Admitting: *Deleted

## 2017-12-10 DIAGNOSIS — I428 Other cardiomyopathies: Secondary | ICD-10-CM

## 2017-12-10 DIAGNOSIS — Z952 Presence of prosthetic heart valve: Secondary | ICD-10-CM | POA: Diagnosis not present

## 2017-12-10 DIAGNOSIS — Z5181 Encounter for therapeutic drug level monitoring: Secondary | ICD-10-CM

## 2017-12-10 DIAGNOSIS — I4821 Permanent atrial fibrillation: Secondary | ICD-10-CM

## 2017-12-10 DIAGNOSIS — I4891 Unspecified atrial fibrillation: Secondary | ICD-10-CM | POA: Diagnosis not present

## 2017-12-10 LAB — BASIC METABOLIC PANEL
BUN / CREAT RATIO: 16 (ref 12–28)
BUN: 14 mg/dL (ref 8–27)
CALCIUM: 9.6 mg/dL (ref 8.7–10.3)
CHLORIDE: 103 mmol/L (ref 96–106)
CO2: 25 mmol/L (ref 20–29)
Creatinine, Ser: 0.89 mg/dL (ref 0.57–1.00)
GFR calc non Af Amer: 66 mL/min/{1.73_m2} (ref >59)
GFR, EST AFRICAN AMERICAN: 76 mL/min/{1.73_m2} (ref >59)
GLUCOSE: 87 mg/dL (ref 65–99)
POTASSIUM: 4.3 mmol/L (ref 3.5–5.2)
Sodium: 139 mmol/L (ref 134–144)

## 2017-12-10 LAB — POCT INR: INR: 2.2 (ref 2.0–3.0)

## 2017-12-10 NOTE — Patient Instructions (Signed)
Description   Today take 1.5 tablets, then Continue taking 1 tablet every day. Recheck INR in 2 weeks. [phone]

## 2017-12-11 ENCOUNTER — Other Ambulatory Visit: Payer: Medicare Other

## 2017-12-24 ENCOUNTER — Ambulatory Visit: Payer: Medicare Other | Admitting: *Deleted

## 2017-12-24 ENCOUNTER — Encounter (INDEPENDENT_AMBULATORY_CARE_PROVIDER_SITE_OTHER): Payer: Self-pay

## 2017-12-24 DIAGNOSIS — Z5181 Encounter for therapeutic drug level monitoring: Secondary | ICD-10-CM | POA: Diagnosis not present

## 2017-12-24 DIAGNOSIS — I4891 Unspecified atrial fibrillation: Secondary | ICD-10-CM | POA: Diagnosis not present

## 2017-12-24 DIAGNOSIS — Z952 Presence of prosthetic heart valve: Secondary | ICD-10-CM

## 2017-12-24 LAB — POCT INR: INR: 1.8 — AB (ref 2.0–3.0)

## 2017-12-24 NOTE — Patient Instructions (Signed)
Description   Today take 1.5 tablets, then change dose to 1 tablet every day except 1.5 tablets on Saturdays. Recheck INR in 2 weeks. [phone]

## 2018-01-02 ENCOUNTER — Ambulatory Visit (INDEPENDENT_AMBULATORY_CARE_PROVIDER_SITE_OTHER): Payer: Medicare Other | Admitting: *Deleted

## 2018-01-02 DIAGNOSIS — I4891 Unspecified atrial fibrillation: Secondary | ICD-10-CM

## 2018-01-02 DIAGNOSIS — I442 Atrioventricular block, complete: Secondary | ICD-10-CM

## 2018-01-02 DIAGNOSIS — I4821 Permanent atrial fibrillation: Secondary | ICD-10-CM

## 2018-01-02 NOTE — Progress Notes (Signed)
Pacemaker check in clinic. Normal device function. Threshold and impedance consistent with previous measurements. Device programmed to maximize longevity. 1 high ventricular rate noted- 4 seconds, probably conducted AF. Device programmed at appropriate safety margins. Histogram distribution appropriate for patient activity level. Device programmed to optimize intrinsic conduction. Estimated longevity >3 years. ROV with GT in January 2020.

## 2018-01-07 ENCOUNTER — Ambulatory Visit: Payer: Medicare Other | Admitting: Pharmacist

## 2018-01-07 DIAGNOSIS — I4891 Unspecified atrial fibrillation: Secondary | ICD-10-CM

## 2018-01-07 DIAGNOSIS — Z952 Presence of prosthetic heart valve: Secondary | ICD-10-CM

## 2018-01-07 DIAGNOSIS — Z5181 Encounter for therapeutic drug level monitoring: Secondary | ICD-10-CM

## 2018-01-07 LAB — POCT INR: INR: 4.1 — AB (ref 2.0–3.0)

## 2018-01-07 NOTE — Patient Instructions (Signed)
Description   No warfarin today then continue 1 tablet every day except 1.5 tablets on Saturdays. Recheck INR in 2 weeks. [phone]

## 2018-01-21 ENCOUNTER — Ambulatory Visit: Payer: Medicare Other | Admitting: *Deleted

## 2018-01-21 DIAGNOSIS — Z952 Presence of prosthetic heart valve: Secondary | ICD-10-CM | POA: Diagnosis not present

## 2018-01-21 DIAGNOSIS — I4891 Unspecified atrial fibrillation: Secondary | ICD-10-CM

## 2018-01-21 DIAGNOSIS — Z5181 Encounter for therapeutic drug level monitoring: Secondary | ICD-10-CM

## 2018-01-21 LAB — POCT INR: INR: 3.1 — AB (ref 2.0–3.0)

## 2018-01-21 NOTE — Patient Instructions (Signed)
Description   Continue 1 tablet every day except 1.5 tablets on Saturdays. Recheck INR in 2 weeks. [phone]

## 2018-02-04 ENCOUNTER — Encounter (INDEPENDENT_AMBULATORY_CARE_PROVIDER_SITE_OTHER): Payer: Self-pay

## 2018-02-04 ENCOUNTER — Ambulatory Visit: Payer: Medicare Other | Admitting: Pharmacist

## 2018-02-04 DIAGNOSIS — Z5181 Encounter for therapeutic drug level monitoring: Secondary | ICD-10-CM | POA: Diagnosis not present

## 2018-02-04 DIAGNOSIS — Z952 Presence of prosthetic heart valve: Secondary | ICD-10-CM

## 2018-02-04 LAB — POCT INR: INR: 3.6 — AB (ref 2.0–3.0)

## 2018-02-04 NOTE — Patient Instructions (Signed)
Description   Take 1/2 tablet today, then continue 1 tablet every day except 1.5 tablets on Saturdays. Recheck INR in 3 weeks. [phone]

## 2018-02-25 ENCOUNTER — Ambulatory Visit: Payer: Medicare Other | Admitting: Pharmacist

## 2018-02-25 DIAGNOSIS — Z5181 Encounter for therapeutic drug level monitoring: Secondary | ICD-10-CM

## 2018-02-25 LAB — POCT INR: INR: 3.7 — AB (ref 2.0–3.0)

## 2018-02-25 NOTE — Patient Instructions (Signed)
Take 1/2 tablet today, then continue 1 tablet every day except 1.5 tablets on Saturdays. Recheck INR in 3 weeks. [phone]

## 2018-03-18 ENCOUNTER — Ambulatory Visit: Payer: Medicare Other | Admitting: Pharmacist

## 2018-03-18 DIAGNOSIS — Z5181 Encounter for therapeutic drug level monitoring: Secondary | ICD-10-CM | POA: Diagnosis not present

## 2018-03-18 DIAGNOSIS — Z952 Presence of prosthetic heart valve: Secondary | ICD-10-CM | POA: Diagnosis not present

## 2018-03-18 LAB — POCT INR: INR: 2.5 (ref 2.0–3.0)

## 2018-03-18 NOTE — Patient Instructions (Signed)
Description   Continue taking 1 tablet every day except 1.5 tablets on Saturdays. Recheck INR in 4 weeks. [phone]

## 2018-04-15 ENCOUNTER — Ambulatory Visit: Payer: Medicare Other | Admitting: *Deleted

## 2018-04-15 DIAGNOSIS — Z5181 Encounter for therapeutic drug level monitoring: Secondary | ICD-10-CM

## 2018-04-15 DIAGNOSIS — Z952 Presence of prosthetic heart valve: Secondary | ICD-10-CM

## 2018-04-15 DIAGNOSIS — I4891 Unspecified atrial fibrillation: Secondary | ICD-10-CM | POA: Diagnosis not present

## 2018-04-15 LAB — POCT INR: INR: 2.2 (ref 2.0–3.0)

## 2018-04-15 NOTE — Patient Instructions (Signed)
Description   Today take 1.5 tablets then continue taking 1 tablet every day except 1.5 tablets on Saturdays. Recheck INR in 3 weeks. [phone]

## 2018-05-06 ENCOUNTER — Ambulatory Visit: Payer: Medicare Other | Admitting: *Deleted

## 2018-05-06 DIAGNOSIS — I4891 Unspecified atrial fibrillation: Secondary | ICD-10-CM

## 2018-05-06 DIAGNOSIS — Z952 Presence of prosthetic heart valve: Secondary | ICD-10-CM

## 2018-05-06 DIAGNOSIS — Z5181 Encounter for therapeutic drug level monitoring: Secondary | ICD-10-CM

## 2018-05-06 DIAGNOSIS — I639 Cerebral infarction, unspecified: Secondary | ICD-10-CM

## 2018-05-06 LAB — POCT INR: INR: 1.7 — AB (ref 2.0–3.0)

## 2018-05-06 NOTE — Patient Instructions (Signed)
Description   Today and tomorrow take 1.5 tablets then start taking 1 tablet every day except 1.5 tablets on Tuesdays and Saturdays. Recheck INR in 2 weeks. [phone]

## 2018-05-20 ENCOUNTER — Ambulatory Visit: Payer: Medicare Other | Admitting: Pharmacist

## 2018-05-20 DIAGNOSIS — Z952 Presence of prosthetic heart valve: Secondary | ICD-10-CM

## 2018-05-20 DIAGNOSIS — Z5181 Encounter for therapeutic drug level monitoring: Secondary | ICD-10-CM | POA: Diagnosis not present

## 2018-05-20 LAB — POCT INR: INR: 2.4 (ref 2.0–3.0)

## 2018-05-20 NOTE — Patient Instructions (Signed)
Description   Today take 1.5 tablets then continue taking 1 tablet every day except 1.5 tablets on Tuesdays and Saturdays. Recheck INR in 2 weeks. [phone]

## 2018-06-03 ENCOUNTER — Ambulatory Visit: Payer: Medicare Other | Admitting: *Deleted

## 2018-06-03 DIAGNOSIS — Z952 Presence of prosthetic heart valve: Secondary | ICD-10-CM | POA: Diagnosis not present

## 2018-06-03 DIAGNOSIS — Z5181 Encounter for therapeutic drug level monitoring: Secondary | ICD-10-CM | POA: Diagnosis not present

## 2018-06-03 DIAGNOSIS — I4891 Unspecified atrial fibrillation: Secondary | ICD-10-CM

## 2018-06-03 LAB — POCT INR: INR: 3 (ref 2.0–3.0)

## 2018-06-03 NOTE — Patient Instructions (Signed)
Description   Continue taking 1 tablet every day except 1.5 tablets on Tuesdays and Saturdays. Recheck INR in 4 Weeks with MD appt. [phone]

## 2018-07-01 ENCOUNTER — Ambulatory Visit (INDEPENDENT_AMBULATORY_CARE_PROVIDER_SITE_OTHER): Payer: Medicare Other | Admitting: Internal Medicine

## 2018-07-01 ENCOUNTER — Encounter: Payer: Self-pay | Admitting: Internal Medicine

## 2018-07-01 ENCOUNTER — Ambulatory Visit (INDEPENDENT_AMBULATORY_CARE_PROVIDER_SITE_OTHER): Payer: Medicare Other | Admitting: *Deleted

## 2018-07-01 VITALS — BP 132/88 | HR 81 | Ht 63.0 in | Wt 125.8 lb

## 2018-07-01 DIAGNOSIS — Z5181 Encounter for therapeutic drug level monitoring: Secondary | ICD-10-CM

## 2018-07-01 DIAGNOSIS — Z952 Presence of prosthetic heart valve: Secondary | ICD-10-CM

## 2018-07-01 DIAGNOSIS — Z950 Presence of cardiac pacemaker: Secondary | ICD-10-CM

## 2018-07-01 DIAGNOSIS — I4891 Unspecified atrial fibrillation: Secondary | ICD-10-CM

## 2018-07-01 DIAGNOSIS — I4821 Permanent atrial fibrillation: Secondary | ICD-10-CM | POA: Diagnosis not present

## 2018-07-01 DIAGNOSIS — I442 Atrioventricular block, complete: Secondary | ICD-10-CM | POA: Diagnosis not present

## 2018-07-01 LAB — POCT INR: INR: 2.9 (ref 2.0–3.0)

## 2018-07-01 MED ORDER — AMOXICILLIN 500 MG PO TABS: ORAL_TABLET | ORAL | 3 refills | Status: AC

## 2018-07-01 NOTE — Patient Instructions (Addendum)
Description   Continue taking 1 tablet every day except 1.5 tablets on Tuesdays and Saturdays. Recheck INR in 4 Weeks. [phone]

## 2018-07-01 NOTE — Progress Notes (Signed)
HPI Pamela Walker returns today for ongoing evaluation and management of CHB, s/p PPM, atrial fib and prior MVR. She is a pleasant 73 yo woman with the above problems who I saw initially several months ago. She has done well in the interim. She denies chest pain or sob. No syncope.  No Known Allergies   Current Outpatient Medications  Medication Sig Dispense Refill  . amoxicillin (AMOXIL) 500 MG tablet Take 4 tablets (2 g) 30-60 minutes prior to dental appointment/prodedure 4 tablet 3  . aspirin EC 81 MG tablet Take 1 tablet (81 mg total) by mouth daily.    Marland Kitchen atorvastatin (LIPITOR) 40 MG tablet Take 1 tablet by mouth daily.    . carvedilol (COREG) 6.25 MG tablet Take 1 tablet (6.25 mg total) by mouth 2 (two) times daily. 180 tablet 3  . levETIRAcetam (KEPPRA) 500 MG tablet Take 500 mg by mouth 2 (two) times daily.    Marland Kitchen lisinopril (PRINIVIL,ZESTRIL) 5 MG tablet Take 1 tablet (5 mg total) by mouth daily. 90 tablet 2  . metFORMIN (GLUCOPHAGE-XR) 500 MG 24 hr tablet Take 500 mg by mouth daily.    Marland Kitchen spironolactone (ALDACTONE) 25 MG tablet Take 25 mg by mouth as directed. MWF    . traMADol (ULTRAM) 50 MG tablet Take 1 tablet (50 mg total) by mouth every 6 (six) hours as needed for moderate pain. 15 tablet 0  . warfarin (COUMADIN) 7.5 MG tablet Take 1 tablet (7.5 mg total) by mouth as directed. 90 tablet 1  . zolpidem (AMBIEN) 10 MG tablet Take 10 mg by mouth at bedtime.     No current facility-administered medications for this visit.      Past Medical History:  Diagnosis Date  . Atrial fibrillation (HCC)   . Hyperlipidemia   . Rheumatic fever/heart disease   . Seizure (HCC)   . Stroke (HCC)     ROS:   All systems reviewed and negative except as noted in the HPI.   Past Surgical History:  Procedure Laterality Date  . APPENDECTOMY    . CESAREAN SECTION    . INSERT / REPLACE / REMOVE PACEMAKER    . MITRAL VALVE REPLACEMENT       Family History  Problem Relation Age of  Onset  . Heart disease Mother        angina  . Arrhythmia Sister        Pacer     Social History   Socioeconomic History  . Marital status: Married    Spouse name: Not on file  . Number of children: Not on file  . Years of education: Not on file  . Highest education level: Not on file  Occupational History  . Not on file  Social Needs  . Financial resource strain: Not on file  . Food insecurity:    Worry: Not on file    Inability: Not on file  . Transportation needs:    Medical: Not on file    Non-medical: Not on file  Tobacco Use  . Smoking status: Former Smoker    Packs/day: 2.00    Years: 10.00    Pack years: 20.00    Types: Cigarettes    Last attempt to quit: 1980    Years since quitting: 40.0  . Smokeless tobacco: Never Used  Substance and Sexual Activity  . Alcohol use: No    Alcohol/week: 0.0 standard drinks    Comment: 1 glass of wine every few months.  Marland Kitchen  Drug use: No  . Sexual activity: Not on file  Lifestyle  . Physical activity:    Days per week: Not on file    Minutes per session: Not on file  . Stress: Not on file  Relationships  . Social connections:    Talks on phone: Not on file    Gets together: Not on file    Attends religious service: Not on file    Active member of club or organization: Not on file    Attends meetings of clubs or organizations: Not on file    Relationship status: Not on file  . Intimate partner violence:    Fear of current or ex partner: Not on file    Emotionally abused: Not on file    Physically abused: Not on file    Forced sexual activity: Not on file  Other Topics Concern  . Not on file  Social History Narrative  . Not on file     BP 132/88   Pulse 81   Ht 5\' 3"  (1.6 m)   Wt 125 lb 12.8 oz (57.1 kg)   SpO2 96%   BMI 22.28 kg/m   Physical Exam:  Well appearing NAD HEENT: Unremarkable Neck:  No JVD, no thyromegally Lymphatics:  No adenopathy Back:  No CVA tenderness Lungs:  Clear with no  wheezes HEART:  Regular rate rhythm, no murmurs, no rubs, no clicks Abd:  soft, positive bowel sounds, no organomegally, no rebound, no guarding Ext:  2 plus pulses, no edema, no cyanosis, no clubbing Skin:  No rashes no nodules Neuro:  CN II through XII intact, motor grossly intact   DEVICE  Normal device function.  See PaceArt for details.   Assess/Plan: 1. CHB - she is asymptomatic, s/p PPM. 2. PPM - her Sorin PPM is working normally with 3 years on the battery. 3. MV replacement - her exam is normal with no MR. Crisp valve sounds. She will continue her systemic anti-coagulation.  Leonia Reeves.D.

## 2018-07-01 NOTE — Patient Instructions (Addendum)
Medication Instructions:  Your physician recommends that you continue on your current medications as directed. Please refer to the Current Medication list given to you today.  Labwork: None ordered.  Testing/Procedures: None ordered.  Follow-Up:  Your physician wants you to follow-up in: 6 months with the device clinic for a device check.   You will receive a reminder letter in the mail two months in advance. If you don't receive a letter, please call our office to schedule the follow-up appointment.  Your physician wants you to follow-up in: one year with Dr. Taylor.   You will receive a reminder letter in the mail two months in advance. If you don't receive a letter, please call our office to schedule the follow-up appointment.  Any Other Special Instructions Will Be Listed Below (If Applicable).  If you need a refill on your cardiac medications before your next appointment, please call your pharmacy.   

## 2018-07-29 ENCOUNTER — Ambulatory Visit (INDEPENDENT_AMBULATORY_CARE_PROVIDER_SITE_OTHER): Payer: Medicare Other

## 2018-07-29 DIAGNOSIS — Z952 Presence of prosthetic heart valve: Secondary | ICD-10-CM | POA: Diagnosis not present

## 2018-07-29 DIAGNOSIS — Z5181 Encounter for therapeutic drug level monitoring: Secondary | ICD-10-CM | POA: Diagnosis not present

## 2018-07-29 DIAGNOSIS — I4891 Unspecified atrial fibrillation: Secondary | ICD-10-CM

## 2018-07-29 LAB — POCT INR: INR: 4 — AB (ref 2.0–3.0)

## 2018-07-29 NOTE — Patient Instructions (Signed)
Description   Skip today's dosage of Coumadin, then resume same dosage 1 tablet every day except 1.5 tablets on Tuesdays and Saturdays. Recheck INR in 3 weeks. [phone]

## 2018-08-19 ENCOUNTER — Ambulatory Visit: Payer: Medicare Other

## 2018-08-19 DIAGNOSIS — I4891 Unspecified atrial fibrillation: Secondary | ICD-10-CM

## 2018-08-19 DIAGNOSIS — Z952 Presence of prosthetic heart valve: Secondary | ICD-10-CM | POA: Diagnosis not present

## 2018-08-19 DIAGNOSIS — Z5181 Encounter for therapeutic drug level monitoring: Secondary | ICD-10-CM

## 2018-08-19 LAB — POCT INR: INR: 1.8 — AB (ref 2.0–3.0)

## 2018-08-19 NOTE — Patient Instructions (Signed)
Description   Take 1.5 tablets today and tomorrow, then resume same dosage 1 tablet every day except 1.5 tablets on Tuesdays and Saturdays. Recheck INR in 2 weeks. [phone]

## 2018-09-02 ENCOUNTER — Other Ambulatory Visit: Payer: Self-pay

## 2018-09-02 ENCOUNTER — Ambulatory Visit (INDEPENDENT_AMBULATORY_CARE_PROVIDER_SITE_OTHER): Payer: Medicare Other | Admitting: Pharmacist

## 2018-09-02 DIAGNOSIS — Z952 Presence of prosthetic heart valve: Secondary | ICD-10-CM | POA: Diagnosis not present

## 2018-09-02 DIAGNOSIS — Z5181 Encounter for therapeutic drug level monitoring: Secondary | ICD-10-CM

## 2018-09-02 DIAGNOSIS — I4891 Unspecified atrial fibrillation: Secondary | ICD-10-CM

## 2018-09-02 LAB — POCT INR: INR: 4 — AB (ref 2.0–3.0)

## 2018-09-02 NOTE — Patient Instructions (Signed)
Description   No warfarin for today then continue same dosage 1 tablet every day except 1.5 tablets on Tuesdays and Saturdays. Recheck INR in 2 weeks. [phone]

## 2018-09-13 ENCOUNTER — Telehealth: Payer: Self-pay

## 2018-09-13 NOTE — Telephone Encounter (Signed)
LMOM FOR PRESCREEN AND DRIVE THRU 

## 2018-09-16 ENCOUNTER — Ambulatory Visit (INDEPENDENT_AMBULATORY_CARE_PROVIDER_SITE_OTHER): Payer: Medicare Other | Admitting: *Deleted

## 2018-09-16 ENCOUNTER — Other Ambulatory Visit: Payer: Self-pay

## 2018-09-16 ENCOUNTER — Telehealth: Payer: Self-pay | Admitting: Pharmacist

## 2018-09-16 DIAGNOSIS — Z952 Presence of prosthetic heart valve: Secondary | ICD-10-CM | POA: Diagnosis not present

## 2018-09-16 DIAGNOSIS — I4891 Unspecified atrial fibrillation: Secondary | ICD-10-CM | POA: Diagnosis not present

## 2018-09-16 DIAGNOSIS — Z5181 Encounter for therapeutic drug level monitoring: Secondary | ICD-10-CM

## 2018-09-16 LAB — POCT INR: INR: 2 (ref 2.0–3.0)

## 2018-09-16 NOTE — Telephone Encounter (Signed)

## 2018-09-27 ENCOUNTER — Telehealth: Payer: Self-pay

## 2018-09-27 NOTE — Telephone Encounter (Signed)

## 2018-09-30 ENCOUNTER — Other Ambulatory Visit: Payer: Self-pay

## 2018-09-30 ENCOUNTER — Ambulatory Visit (INDEPENDENT_AMBULATORY_CARE_PROVIDER_SITE_OTHER): Payer: Medicare Other | Admitting: *Deleted

## 2018-09-30 DIAGNOSIS — Z952 Presence of prosthetic heart valve: Secondary | ICD-10-CM

## 2018-09-30 DIAGNOSIS — Z5181 Encounter for therapeutic drug level monitoring: Secondary | ICD-10-CM | POA: Diagnosis not present

## 2018-09-30 DIAGNOSIS — I4891 Unspecified atrial fibrillation: Secondary | ICD-10-CM | POA: Diagnosis not present

## 2018-09-30 LAB — POCT INR: INR: 2.8 (ref 2.0–3.0)

## 2018-10-11 ENCOUNTER — Telehealth: Payer: Self-pay

## 2018-10-11 NOTE — Telephone Encounter (Signed)
lmom for prescreen  

## 2018-10-14 ENCOUNTER — Ambulatory Visit (INDEPENDENT_AMBULATORY_CARE_PROVIDER_SITE_OTHER): Payer: Medicare Other | Admitting: *Deleted

## 2018-10-14 ENCOUNTER — Other Ambulatory Visit: Payer: Self-pay

## 2018-10-14 DIAGNOSIS — I4891 Unspecified atrial fibrillation: Secondary | ICD-10-CM | POA: Diagnosis not present

## 2018-10-14 DIAGNOSIS — Z5181 Encounter for therapeutic drug level monitoring: Secondary | ICD-10-CM

## 2018-10-14 DIAGNOSIS — Z952 Presence of prosthetic heart valve: Secondary | ICD-10-CM | POA: Diagnosis not present

## 2018-10-14 LAB — POCT INR: INR: 4.9 — AB (ref 2.0–3.0)

## 2018-10-14 NOTE — Patient Instructions (Signed)
Description   Spoke with pt and instructed pt to hold today and tomorrow's dose then continue same dosage 1 tablet every day except 1.5 tablets on Tuesdays and Saturdays. Recheck INR in 2 weeks. [phone]

## 2018-10-25 ENCOUNTER — Telehealth: Payer: Self-pay

## 2018-10-25 NOTE — Telephone Encounter (Signed)

## 2018-10-28 ENCOUNTER — Ambulatory Visit (INDEPENDENT_AMBULATORY_CARE_PROVIDER_SITE_OTHER): Payer: Medicare Other | Admitting: *Deleted

## 2018-10-28 ENCOUNTER — Other Ambulatory Visit: Payer: Self-pay

## 2018-10-28 DIAGNOSIS — Z952 Presence of prosthetic heart valve: Secondary | ICD-10-CM | POA: Diagnosis not present

## 2018-10-28 DIAGNOSIS — I4891 Unspecified atrial fibrillation: Secondary | ICD-10-CM | POA: Diagnosis not present

## 2018-10-28 DIAGNOSIS — Z5181 Encounter for therapeutic drug level monitoring: Secondary | ICD-10-CM | POA: Diagnosis not present

## 2018-10-28 LAB — POCT INR: INR: 2.9 (ref 2.0–3.0)

## 2018-11-20 ENCOUNTER — Telehealth: Payer: Self-pay

## 2018-11-20 NOTE — Telephone Encounter (Signed)

## 2018-11-25 ENCOUNTER — Ambulatory Visit (INDEPENDENT_AMBULATORY_CARE_PROVIDER_SITE_OTHER): Payer: Medicare Other | Admitting: *Deleted

## 2018-11-25 ENCOUNTER — Other Ambulatory Visit: Payer: Self-pay

## 2018-11-25 DIAGNOSIS — Z5181 Encounter for therapeutic drug level monitoring: Secondary | ICD-10-CM | POA: Diagnosis not present

## 2018-11-25 DIAGNOSIS — Z952 Presence of prosthetic heart valve: Secondary | ICD-10-CM

## 2018-11-25 DIAGNOSIS — I4891 Unspecified atrial fibrillation: Secondary | ICD-10-CM | POA: Diagnosis not present

## 2018-11-25 LAB — POCT INR: INR: 2.8 (ref 2.0–3.0)

## 2018-11-25 NOTE — Patient Instructions (Addendum)
Description   Continue the same dosage 1 tablet every day except 1.5 tablets on Tuesdays and Saturdays. Recheck INR in 5 weeks. [phone]

## 2018-12-23 ENCOUNTER — Telehealth: Payer: Self-pay

## 2018-12-23 NOTE — Telephone Encounter (Signed)
lmom for prescreen  

## 2019-01-02 ENCOUNTER — Telehealth: Payer: Self-pay

## 2019-01-02 NOTE — Telephone Encounter (Signed)
lmom for prescreen  

## 2019-01-06 ENCOUNTER — Other Ambulatory Visit: Payer: Self-pay

## 2019-01-06 ENCOUNTER — Ambulatory Visit (INDEPENDENT_AMBULATORY_CARE_PROVIDER_SITE_OTHER): Payer: Medicare Other | Admitting: *Deleted

## 2019-01-06 DIAGNOSIS — Z5181 Encounter for therapeutic drug level monitoring: Secondary | ICD-10-CM | POA: Diagnosis not present

## 2019-01-06 DIAGNOSIS — I4891 Unspecified atrial fibrillation: Secondary | ICD-10-CM | POA: Diagnosis not present

## 2019-01-06 DIAGNOSIS — Z952 Presence of prosthetic heart valve: Secondary | ICD-10-CM

## 2019-01-06 LAB — POCT INR: INR: 2.5 (ref 2.0–3.0)

## 2019-01-06 NOTE — Patient Instructions (Signed)
Description   Continue the same dosage 1 tablet every day except 1.5 tablets on Tuesdays and Saturdays. Recheck INR in 5 weeks. [phone]     

## 2019-02-10 ENCOUNTER — Telehealth: Payer: Self-pay

## 2019-02-10 NOTE — Telephone Encounter (Signed)
LMOVM

## 2019-02-11 ENCOUNTER — Ambulatory Visit (INDEPENDENT_AMBULATORY_CARE_PROVIDER_SITE_OTHER): Payer: Medicare Other | Admitting: *Deleted

## 2019-02-11 ENCOUNTER — Telehealth: Payer: Self-pay | Admitting: Emergency Medicine

## 2019-02-11 ENCOUNTER — Other Ambulatory Visit: Payer: Self-pay

## 2019-02-11 DIAGNOSIS — Z5181 Encounter for therapeutic drug level monitoring: Secondary | ICD-10-CM | POA: Diagnosis not present

## 2019-02-11 DIAGNOSIS — Z952 Presence of prosthetic heart valve: Secondary | ICD-10-CM

## 2019-02-11 DIAGNOSIS — I428 Other cardiomyopathies: Secondary | ICD-10-CM

## 2019-02-11 DIAGNOSIS — I099 Rheumatic heart disease, unspecified: Secondary | ICD-10-CM

## 2019-02-11 DIAGNOSIS — I442 Atrioventricular block, complete: Secondary | ICD-10-CM | POA: Diagnosis not present

## 2019-02-11 DIAGNOSIS — I4891 Unspecified atrial fibrillation: Secondary | ICD-10-CM

## 2019-02-11 LAB — POCT INR: INR: 2.9 (ref 2.0–3.0)

## 2019-02-11 NOTE — Progress Notes (Signed)
Device check in clinic by industry. See scanned report for details. Follow up in 6 months for pacer check in clinic 08/12/19.

## 2019-02-11 NOTE — Patient Instructions (Addendum)
  Description   Continue the same dosage 1 tablet every day except 1.5 tablets on Tuesdays and Saturdays. Recheck INR in 6 weeks. [phone]

## 2019-02-11 NOTE — Telephone Encounter (Signed)
Thanks for your help.  Given history of MVR and endocarditis, she needs to take antibiotics for SBE prophylaxis as previously prescribed.  Nelva Bush, MD [facility] Pager: [phone]

## 2019-02-11 NOTE — Telephone Encounter (Signed)
Patient has dental appointment 02/17/19 and wants to know if she should take antibiotics before her appontment.

## 2019-02-11 NOTE — Telephone Encounter (Signed)
I spoke to the patient and informed her that she has 3 refills for this medication.  She verbalized understanding and was thankful for the call.

## 2019-02-13 NOTE — Telephone Encounter (Signed)
It is fine to patient to establish with any provider in [place].  Nelva Bush, MD [facility] Pager: [phone]

## 2019-02-13 NOTE — Telephone Encounter (Signed)
Cardiac referral placed d/t Pt was seeing Dr. Saunders Revel, but Dr. Saunders Revel has relocated to [facility].   Pt has not had general cardiac f/u since March 2019.  Ok per Dr. Lovena Le to refer to another local cardiologist.

## 2019-02-13 NOTE — Addendum Note (Signed)
Addended by: Willeen Cass A on: 02/13/2019 10:01 AM   Modules accepted: Orders

## 2019-02-18 ENCOUNTER — Other Ambulatory Visit: Payer: Self-pay | Admitting: Cardiovascular Disease

## 2019-03-24 ENCOUNTER — Ambulatory Visit: Payer: Medicare Other | Admitting: *Deleted

## 2019-03-24 ENCOUNTER — Other Ambulatory Visit: Payer: Self-pay

## 2019-03-24 DIAGNOSIS — Z5181 Encounter for therapeutic drug level monitoring: Secondary | ICD-10-CM

## 2019-03-24 DIAGNOSIS — Z952 Presence of prosthetic heart valve: Secondary | ICD-10-CM

## 2019-03-24 DIAGNOSIS — I4891 Unspecified atrial fibrillation: Secondary | ICD-10-CM | POA: Diagnosis not present

## 2019-03-24 LAB — POCT INR: INR: 3.5 — AB (ref 2.0–3.0)

## 2019-03-24 MED ORDER — WARFARIN SODIUM 7.5 MG PO TABS: ORAL_TABLET | ORAL | 0 refills | Status: DC

## 2019-03-24 NOTE — Patient Instructions (Signed)
Description   Continue the same dosage 1 tablet every day except 1.5 tablets on Tuesdays and Saturdays. Recheck INR in 6 weeks. [phone]

## 2019-05-05 ENCOUNTER — Encounter (INDEPENDENT_AMBULATORY_CARE_PROVIDER_SITE_OTHER): Payer: Self-pay

## 2019-05-05 ENCOUNTER — Ambulatory Visit (INDEPENDENT_AMBULATORY_CARE_PROVIDER_SITE_OTHER): Payer: Medicare Other | Admitting: Pharmacist

## 2019-05-05 ENCOUNTER — Other Ambulatory Visit: Payer: Self-pay

## 2019-05-05 DIAGNOSIS — I4891 Unspecified atrial fibrillation: Secondary | ICD-10-CM

## 2019-05-05 DIAGNOSIS — Z5181 Encounter for therapeutic drug level monitoring: Secondary | ICD-10-CM

## 2019-05-05 DIAGNOSIS — Z952 Presence of prosthetic heart valve: Secondary | ICD-10-CM | POA: Diagnosis not present

## 2019-05-05 LAB — POCT INR: INR: 1.9 — AB (ref 2.0–3.0)

## 2019-05-05 NOTE — Patient Instructions (Addendum)
Description   Take 1.5 tablets today then continue the same dosage 1 tablet every day except 1.5 tablets on Tuesdays and Saturdays. Recheck INR in 5 weeks. [phone]

## 2019-06-09 ENCOUNTER — Ambulatory Visit (INDEPENDENT_AMBULATORY_CARE_PROVIDER_SITE_OTHER): Payer: Medicare Other

## 2019-06-09 ENCOUNTER — Other Ambulatory Visit: Payer: Self-pay

## 2019-06-09 DIAGNOSIS — I4891 Unspecified atrial fibrillation: Secondary | ICD-10-CM

## 2019-06-09 DIAGNOSIS — Z952 Presence of prosthetic heart valve: Secondary | ICD-10-CM

## 2019-06-09 DIAGNOSIS — Z5181 Encounter for therapeutic drug level monitoring: Secondary | ICD-10-CM

## 2019-06-09 LAB — POCT INR: INR: 2.7 (ref 2.0–3.0)

## 2019-06-09 MED ORDER — WARFARIN SODIUM 7.5 MG PO TABS: ORAL_TABLET | ORAL | 1 refills | Status: DC

## 2019-07-21 ENCOUNTER — Other Ambulatory Visit: Payer: Self-pay

## 2019-07-21 ENCOUNTER — Ambulatory Visit (INDEPENDENT_AMBULATORY_CARE_PROVIDER_SITE_OTHER): Payer: Medicare Other

## 2019-07-21 ENCOUNTER — Encounter (INDEPENDENT_AMBULATORY_CARE_PROVIDER_SITE_OTHER): Payer: Self-pay

## 2019-07-21 DIAGNOSIS — Z952 Presence of prosthetic heart valve: Secondary | ICD-10-CM | POA: Diagnosis not present

## 2019-07-21 DIAGNOSIS — I4891 Unspecified atrial fibrillation: Secondary | ICD-10-CM

## 2019-07-21 DIAGNOSIS — Z5181 Encounter for therapeutic drug level monitoring: Secondary | ICD-10-CM | POA: Diagnosis not present

## 2019-07-21 LAB — POCT INR: INR: 1.4 — AB (ref 2.0–3.0)

## 2019-07-21 NOTE — Patient Instructions (Signed)
Description   Take 1.5 tablets today, then start taking 1 tablet every day except 1.5 tablets on Tuesdays, Thursdays and Saturdays. Recheck INR in 1 week. [phone]

## 2019-07-28 ENCOUNTER — Other Ambulatory Visit: Payer: Self-pay

## 2019-07-28 ENCOUNTER — Ambulatory Visit (INDEPENDENT_AMBULATORY_CARE_PROVIDER_SITE_OTHER): Payer: Medicare Other | Admitting: *Deleted

## 2019-07-28 DIAGNOSIS — Z952 Presence of prosthetic heart valve: Secondary | ICD-10-CM | POA: Diagnosis not present

## 2019-07-28 DIAGNOSIS — Z5181 Encounter for therapeutic drug level monitoring: Secondary | ICD-10-CM | POA: Diagnosis not present

## 2019-07-28 DIAGNOSIS — I4891 Unspecified atrial fibrillation: Secondary | ICD-10-CM | POA: Diagnosis not present

## 2019-07-28 LAB — POCT INR: INR: 3.1 — AB (ref 2.0–3.0)

## 2019-07-28 NOTE — Patient Instructions (Addendum)
Description   Continue taking 1 tablet every day except 1.5 tablets on Tuesdays, Thursdays and Saturdays. Recheck INR in 1 week, when you see Dr. Ladona Ridgel. [phone]

## 2019-08-04 ENCOUNTER — Encounter: Payer: Medicare Other | Admitting: Internal Medicine

## 2019-08-04 ENCOUNTER — Ambulatory Visit: Payer: Medicare Other | Admitting: *Deleted

## 2019-08-04 ENCOUNTER — Other Ambulatory Visit: Payer: Self-pay

## 2019-08-04 DIAGNOSIS — Z5181 Encounter for therapeutic drug level monitoring: Secondary | ICD-10-CM | POA: Diagnosis not present

## 2019-08-04 DIAGNOSIS — I4891 Unspecified atrial fibrillation: Secondary | ICD-10-CM | POA: Diagnosis not present

## 2019-08-04 DIAGNOSIS — Z952 Presence of prosthetic heart valve: Secondary | ICD-10-CM

## 2019-08-04 LAB — POCT INR: INR: 3.5 — AB (ref 2.0–3.0)

## 2019-08-04 NOTE — Patient Instructions (Signed)
Description   Continue taking 1 tablet every day except 1.5 tablets on Tuesdays, Thursdays and Saturdays. Recheck INR in 2 weeks. [phone]

## 2019-08-12 ENCOUNTER — Encounter: Payer: Medicare Other | Admitting: Internal Medicine

## 2019-08-15 ENCOUNTER — Encounter: Payer: Medicare Other | Admitting: Internal Medicine

## 2019-08-18 ENCOUNTER — Other Ambulatory Visit: Payer: Self-pay

## 2019-08-18 ENCOUNTER — Ambulatory Visit: Payer: Medicare Other | Admitting: *Deleted

## 2019-08-18 DIAGNOSIS — Z5181 Encounter for therapeutic drug level monitoring: Secondary | ICD-10-CM | POA: Diagnosis not present

## 2019-08-18 DIAGNOSIS — I4891 Unspecified atrial fibrillation: Secondary | ICD-10-CM

## 2019-08-18 DIAGNOSIS — Z952 Presence of prosthetic heart valve: Secondary | ICD-10-CM

## 2019-08-18 LAB — POCT INR: INR: 3.1 — AB (ref 2.0–3.0)

## 2019-08-18 NOTE — Patient Instructions (Signed)
Description   Continue taking 1 tablet every day except 1.5 tablets on Tuesdays, Thursdays and Saturdays. Recheck INR in 3 weeks. [phone]

## 2019-09-08 ENCOUNTER — Ambulatory Visit (INDEPENDENT_AMBULATORY_CARE_PROVIDER_SITE_OTHER): Payer: Medicare Other | Admitting: *Deleted

## 2019-09-08 ENCOUNTER — Encounter: Payer: Medicare Other | Admitting: Internal Medicine

## 2019-09-08 ENCOUNTER — Other Ambulatory Visit: Payer: Self-pay

## 2019-09-08 ENCOUNTER — Ambulatory Visit: Payer: Medicare Other | Admitting: Internal Medicine

## 2019-09-08 ENCOUNTER — Encounter: Payer: Self-pay | Admitting: Internal Medicine

## 2019-09-08 VITALS — BP 150/76 | HR 73 | Resp 15 | Ht 63.0 in | Wt 139.0 lb

## 2019-09-08 DIAGNOSIS — I442 Atrioventricular block, complete: Secondary | ICD-10-CM | POA: Diagnosis not present

## 2019-09-08 DIAGNOSIS — Z5181 Encounter for therapeutic drug level monitoring: Secondary | ICD-10-CM

## 2019-09-08 DIAGNOSIS — I4891 Unspecified atrial fibrillation: Secondary | ICD-10-CM

## 2019-09-08 DIAGNOSIS — Z952 Presence of prosthetic heart valve: Secondary | ICD-10-CM | POA: Diagnosis not present

## 2019-09-08 DIAGNOSIS — Z950 Presence of cardiac pacemaker: Secondary | ICD-10-CM | POA: Diagnosis not present

## 2019-09-08 LAB — POCT INR: INR: 2.6 (ref 2.0–3.0)

## 2019-09-08 NOTE — Patient Instructions (Signed)
Description   Continue taking 1 tablet every day except 1.5 tablets on Tuesdays, Thursdays and Saturdays. Recheck INR in 4 weeks. [phone]

## 2019-09-08 NOTE — Patient Instructions (Addendum)
Medication Instructions:  Your physician recommends that you continue on your current medications as directed. Please refer to the Current Medication list given to you today.  Labwork: None ordered.  Testing/Procedures: None ordered.  Follow-Up: Your physician wants you to follow-up in: 6 months with the device clinic.    March 09, 2020 at 8:30 am at the [facility] office   Your physician wants you to follow-up in: one year with Dr. Ladona Ridgel.  You will receive a reminder letter in the mail two months in advance. If you don't receive a letter, please call our office to schedule the follow-up appointment.   Any Other Special Instructions Will Be Listed Below (If Applicable).  If you need a refill on your cardiac medications before your next appointment, please call your pharmacy.

## 2019-09-08 NOTE — Progress Notes (Signed)
HPI Pamela Walker returns today for followup. She is a pleasant 74 yo woman with MV replacement, CHB, s/p PPM insertion. She has done well in the interim. She denies chest pain or sob. She remains on chronic coumadin therapy. She is just over a year out from ERI. Her Sorin PM is working normally. She has chronic atrial fib. No Known Allergies   Current Outpatient Medications  Medication Sig Dispense Refill  . amoxicillin (AMOXIL) 500 MG tablet Take 4 tablets (2 g) 30-60 minutes prior to dental appointment/prodedure 4 tablet 3  . aspirin EC 81 MG tablet Take 1 tablet (81 mg total) by mouth daily.    Marland Kitchen atorvastatin (LIPITOR) 40 MG tablet Take 1 tablet by mouth daily.    . carvedilol (COREG) 6.25 MG tablet Take 1 tablet (6.25 mg total) by mouth 2 (two) times daily. 180 tablet 3  . levETIRAcetam (KEPPRA) 500 MG tablet Take 500 mg by mouth 2 (two) times daily.    Marland Kitchen lisinopril (PRINIVIL,ZESTRIL) 5 MG tablet Take 1 tablet (5 mg total) by mouth daily. 90 tablet 2  . metFORMIN (GLUCOPHAGE-XR) 500 MG 24 hr tablet Take 500 mg by mouth daily.    Marland Kitchen spironolactone (ALDACTONE) 25 MG tablet Take 25 mg by mouth as directed. MWF    . traMADol (ULTRAM) 50 MG tablet Take 1 tablet (50 mg total) by mouth every 6 (six) hours as needed for moderate pain. 15 tablet 0  . warfarin (COUMADIN) 7.5 MG tablet Take as directed by the coumadin clinic 110 tablet 1  . zolpidem (AMBIEN) 10 MG tablet Take 10 mg by mouth at bedtime.     No current facility-administered medications for this visit.     Past Medical History:  Diagnosis Date  . Atrial fibrillation (HCC)   . Hyperlipidemia   . Rheumatic fever/heart disease   . Seizure (HCC)   . Stroke (HCC)     ROS:   All systems reviewed and negative except as noted in the HPI.   Past Surgical History:  Procedure Laterality Date  . APPENDECTOMY    . CESAREAN SECTION    . INSERT / REPLACE / REMOVE PACEMAKER    . MITRAL VALVE REPLACEMENT       Family  History  Problem Relation Age of Onset  . Heart disease Mother        angina  . Arrhythmia Sister        Pacer     Social History   Socioeconomic History  . Marital status: Married    Spouse name: Not on file  . Number of children: Not on file  . Years of education: Not on file  . Highest education level: Not on file  Occupational History  . Not on file  Tobacco Use  . Smoking status: Former Smoker    Packs/day: 2.00    Years: 10.00    Pack years: 20.00    Types: Cigarettes    Quit date: 1980    Years since quitting: 41.2  . Smokeless tobacco: Never Used  Substance and Sexual Activity  . Alcohol use: No    Alcohol/week: 0.0 standard drinks    Comment: 1 glass of wine every few months.  . Drug use: No  . Sexual activity: Not on file  Other Topics Concern  . Not on file  Social History Narrative  . Not on file   Social Determinants of Health   Financial Resource Strain:   . Difficulty of Paying  Living Expenses:   Food Insecurity:   . Worried About Charity fundraiser in the Last Year:   . Arboriculturist in the Last Year:   Transportation Needs:   . Film/video editor (Medical):   Marland Kitchen Lack of Transportation (Non-Medical):   Physical Activity:   . Days of Exercise per Week:   . Minutes of Exercise per Session:   Stress:   . Feeling of Stress :   Social Connections:   . Frequency of Communication with Friends and Family:   . Frequency of Social Gatherings with Friends and Family:   . Attends Religious Services:   . Active Member of Clubs or Organizations:   . Attends Archivist Meetings:   Marland Kitchen Marital Status:   Intimate Partner Violence:   . Fear of Current or Ex-Partner:   . Emotionally Abused:   Marland Kitchen Physically Abused:   . Sexually Abused:      BP (!) 150/76   Pulse 73   Resp 15   Ht 5\' 3"  (1.6 m)   Wt 139 lb (63 kg)   SpO2 97%   BMI 24.62 kg/m   Physical Exam:  Well appearing NAD HEENT: Unremarkable Neck:  No JVD, no  thyromegally Lymphatics:  No adenopathy Back:  No CVA tenderness Lungs:  Clear with no wheezes HEART:  Regular rate rhythm, no murmurs, no rubs, no clicks Abd:  soft, positive bowel sounds, no organomegally, no rebound, no guarding Ext:  2 plus pulses, no edema, no cyanosis, no clubbing Skin:  No rashes no nodules Neuro:  CN II through XII intact, motor grossly intact  EKG - atrial fib with ventricular pacing  DEVICE  Normal device function.  See PaceArt for details.   Assess/Plan: 1. CHB - she is asymptomatic, s/p PPM 2. Atrial fib - she is chronically in atrial fib. Her rates are well controlled. 3. MV replacement - her valve on exam appears to be working normally.  4. coags - she has had no bleeding on coumadin. She would like to find a closer place to get checked.  Mikle Bosworth.D.

## 2019-09-27 ENCOUNTER — Emergency Department (HOSPITAL_COMMUNITY)
Admission: EM | Admit: 2019-09-27 | Discharge: 2019-09-27 | Disposition: A | Discharge status: Home / Self Care | Payer: Medicare Other | Attending: Emergency Medicine | Admitting: Emergency Medicine

## 2019-09-27 ENCOUNTER — Encounter (HOSPITAL_COMMUNITY): Payer: Self-pay | Admitting: Primary Care

## 2019-09-27 ENCOUNTER — Emergency Department (HOSPITAL_BASED_OUTPATIENT_CLINIC_OR_DEPARTMENT_OTHER): Payer: Medicare Other

## 2019-09-27 ENCOUNTER — Emergency Department (HOSPITAL_COMMUNITY): Payer: Medicare Other

## 2019-09-27 ENCOUNTER — Other Ambulatory Visit: Payer: Self-pay

## 2019-09-27 DIAGNOSIS — M7989 Other specified soft tissue disorders: Secondary | ICD-10-CM | POA: Diagnosis not present

## 2019-09-27 DIAGNOSIS — Z87891 Personal history of nicotine dependence: Secondary | ICD-10-CM | POA: Diagnosis not present

## 2019-09-27 DIAGNOSIS — E119 Type 2 diabetes mellitus without complications: Secondary | ICD-10-CM | POA: Insufficient documentation

## 2019-09-27 DIAGNOSIS — R569 Unspecified convulsions: Secondary | ICD-10-CM | POA: Diagnosis present

## 2019-09-27 DIAGNOSIS — R0602 Shortness of breath: Secondary | ICD-10-CM | POA: Insufficient documentation

## 2019-09-27 DIAGNOSIS — Z7982 Long term (current) use of aspirin: Secondary | ICD-10-CM | POA: Insufficient documentation

## 2019-09-27 DIAGNOSIS — R2243 Localized swelling, mass and lump, lower limb, bilateral: Secondary | ICD-10-CM | POA: Diagnosis not present

## 2019-09-27 DIAGNOSIS — Z7901 Long term (current) use of anticoagulants: Secondary | ICD-10-CM | POA: Insufficient documentation

## 2019-09-27 DIAGNOSIS — Z79899 Other long term (current) drug therapy: Secondary | ICD-10-CM | POA: Diagnosis not present

## 2019-09-27 DIAGNOSIS — Z7984 Long term (current) use of oral hypoglycemic drugs: Secondary | ICD-10-CM | POA: Diagnosis not present

## 2019-09-27 DIAGNOSIS — I639 Cerebral infarction, unspecified: Secondary | ICD-10-CM | POA: Insufficient documentation

## 2019-09-27 HISTORY — DX: Type 2 diabetes mellitus without complications: E11.9

## 2019-09-27 LAB — CBC
HCT: 35.4 % — ABNORMAL LOW (ref 36.0–46.0)
Hemoglobin: 11.3 g/dL — ABNORMAL LOW (ref 12.0–15.0)
MCH: 30.7 pg (ref 26.0–34.0)
MCHC: 31.9 g/dL (ref 30.0–36.0)
MCV: 96.2 fL (ref 80.0–100.0)
Platelets: 160 10*3/uL (ref 150–400)
RBC: 3.68 MIL/uL — ABNORMAL LOW (ref 3.87–5.11)
RDW: 14.3 % (ref 11.5–15.5)
WBC: 4.4 10*3/uL (ref 4.0–10.5)
nRBC: 0 % (ref 0.0–0.2)

## 2019-09-27 LAB — DIFFERENTIAL
Abs Immature Granulocytes: 0.01 10*3/uL (ref 0.00–0.07)
Basophils Absolute: 0 10*3/uL (ref 0.0–0.1)
Basophils Relative: 1 %
Eosinophils Absolute: 0.2 10*3/uL (ref 0.0–0.5)
Eosinophils Relative: 5 %
Immature Granulocytes: 0 %
Lymphocytes Relative: 49 %
Lymphs Abs: 2.2 10*3/uL (ref 0.7–4.0)
Monocytes Absolute: 0.6 10*3/uL (ref 0.1–1.0)
Monocytes Relative: 13 %
Neutro Abs: 1.4 10*3/uL — ABNORMAL LOW (ref 1.7–7.7)
Neutrophils Relative %: 32 %

## 2019-09-27 LAB — COMPREHENSIVE METABOLIC PANEL
ALT: 23 U/L (ref 0–44)
AST: 36 U/L (ref 15–41)
Albumin: 4.1 g/dL (ref 3.5–5.0)
Alkaline Phosphatase: 61 U/L (ref 38–126)
Anion gap: 17 — ABNORMAL HIGH (ref 5–15)
BUN: 23 mg/dL (ref 8–23)
CO2: 19 mmol/L — ABNORMAL LOW (ref 22–32)
Calcium: 9.8 mg/dL (ref 8.9–10.3)
Chloride: 99 mmol/L (ref 98–111)
Creatinine, Ser: 1.07 mg/dL — ABNORMAL HIGH (ref 0.44–1.00)
GFR calc Af Amer: >60 mL/min (ref >60)
GFR calc non Af Amer: 52 mL/min — ABNORMAL LOW (ref >60)
Glucose, Bld: 106 mg/dL — ABNORMAL HIGH (ref 70–99)
Potassium: 4.8 mmol/L (ref 3.5–5.1)
Sodium: 135 mmol/L (ref 135–145)
Total Bilirubin: 1.2 mg/dL (ref 0.3–1.2)
Total Protein: 7.5 g/dL (ref 6.5–8.1)

## 2019-09-27 LAB — URINALYSIS, ROUTINE W REFLEX MICROSCOPIC
Bacteria, UA: NONE SEEN
Bilirubin Urine: NEGATIVE
Glucose, UA: NEGATIVE mg/dL
Hgb urine dipstick: NEGATIVE
Ketones, ur: NEGATIVE mg/dL
Leukocytes,Ua: NEGATIVE
Nitrite: NEGATIVE
Protein, ur: NEGATIVE mg/dL
Specific Gravity, Urine: 1.03 (ref 1.005–1.030)
pH: 6 (ref 5.0–8.0)

## 2019-09-27 LAB — I-STAT CHEM 8, ED
BUN: 24 mg/dL — ABNORMAL HIGH (ref 8–23)
Calcium, Ion: 1.25 mmol/L (ref 1.15–1.40)
Chloride: 100 mmol/L (ref 98–111)
Creatinine, Ser: 1 mg/dL (ref 0.44–1.00)
Glucose, Bld: 101 mg/dL — ABNORMAL HIGH (ref 70–99)
HCT: 35 % — ABNORMAL LOW (ref 36.0–46.0)
Hemoglobin: 11.9 g/dL — ABNORMAL LOW (ref 12.0–15.0)
Potassium: 4.7 mmol/L (ref 3.5–5.1)
Sodium: 134 mmol/L — ABNORMAL LOW (ref 135–145)
TCO2: 22 mmol/L (ref 22–32)

## 2019-09-27 LAB — APTT: aPTT: 35 seconds (ref 24–36)

## 2019-09-27 LAB — BRAIN NATRIURETIC PEPTIDE: B Natriuretic Peptide: 115.8 pg/mL — ABNORMAL HIGH (ref 0.0–100.0)

## 2019-09-27 LAB — PROTIME-INR
INR: 2.6 — ABNORMAL HIGH (ref 0.8–1.2)
Prothrombin Time: 27.5 seconds — ABNORMAL HIGH (ref 11.4–15.2)

## 2019-09-27 LAB — CBG MONITORING, ED: Glucose-Capillary: 95 mg/dL (ref 70–99)

## 2019-09-27 LAB — TROPONIN I (HIGH SENSITIVITY): Troponin I (High Sensitivity): 7 ng/L (ref <18)

## 2019-09-27 MED ORDER — LEVETIRACETAM IN NACL 1000 MG/100ML IV SOLN
1000.0000 mg | Freq: Once | INTRAVENOUS | Status: AC
  Administered 2019-09-27: 17:00:00 1000 mg via INTRAVENOUS
  Filled 2019-09-27: qty 100

## 2019-09-27 MED ORDER — IOHEXOL 300 MG/ML  SOLN
75.0000 mL | Freq: Once | INTRAMUSCULAR | Status: AC | PRN
  Administered 2019-09-27: 75 mL via INTRAVENOUS

## 2019-09-27 MED ORDER — IOHEXOL 350 MG/ML SOLN
75.0000 mL | Freq: Once | INTRAVENOUS | Status: AC | PRN
  Administered 2019-09-27: 75 mL via INTRAVENOUS

## 2019-09-27 MED ORDER — LEVETIRACETAM 750 MG PO TABS: 750.0000 mg | ORAL_TABLET | Freq: Two times a day (BID) | ORAL | 0 refills | Status: DC

## 2019-09-27 MED ORDER — LEVETIRACETAM 500 MG PO TABS: 500.0000 mg | ORAL_TABLET | Freq: Two times a day (BID) | ORAL | 0 refills | Status: AC

## 2019-09-27 MED ORDER — SODIUM CHLORIDE 0.9% FLUSH: 3.0000 mL | Freq: Once | INTRAVENOUS | Status: DC

## 2019-09-27 NOTE — ED Provider Notes (Addendum)
[facility] EMERGENCY DEPARTMENT Provider Note   CSN: [id number] Arrival date & time: 09/27/19  1444  An emergency department physician performed an initial assessment on this suspected stroke patient at 1445.  History Chief Complaint  Patient presents with  . Code Stroke    Pamela Walker is a 74 y.o. female.  The history is provided by the patient and the spouse. The history is limited by a language barrier. A language interpreter was used ([employer] tele interpreter).     74 year old female with past medical history of atrial fibrillation on coumadin, HLD, seizure disorder on keppra, previous CVA presenting department brought in by EMS as a code stroke complaining of L sided facial droop, L sided weakness, slurred speech.  Last known normal 1400. Per EMS the patient had some L sided drift, L facial droop, LUE> LLE weakness and slurred speech/word finding difficulty. No treatments given PTA. Glucose wnl with EMS.  Per spouse they were driving the car when patient became less responsive with increased tonicity and subsequent convulsions.  The seizure-like activity lasted approximately 2 minutes subsequently resolved.  Patient also bit her tongue and has a laceration to the left lateral tongue.  Afterwards was when the husband noted the strokelike symptoms including a left-sided facial droop and weakness subsequently calling EMS.  They also noted that the patient has had significant cough throughout the past 2 weeks nonproductive in nature without any hemoptysis.  Patient is also complained of left posterior knee pain for the past week without significant lower extremity swelling.  Patient has not had any recent trips, flights, surgeries, history of cancer, previous blood clots, hormone therapy.  Patient has mechanical heart valve on warfarin.  Past Medical History:  Diagnosis Date  . Atrial fibrillation (HCC)    on coumadin  . DM (diabetes mellitus) (HCC)   . Hyperlipidemia    . Rheumatic fever/heart disease   . Seizure (HCC)   . Stroke [facility]) 2017   [facility]    Patient Active Problem List   Diagnosis Date Noted  . Stroke (HCC)   . Pacemaker 09/08/2019  . Nonischemic cardiomyopathy (HCC) 03/09/2017  . Rheumatic heart disease 03/09/2017  . Complete heart block (HCC) 03/09/2017  . Encounter for therapeutic drug monitoring 06/29/2016  . Atrial fibrillation (HCC) [I48.91] 06/29/2016  . CVA (cerebral vascular accident) (HCC) 06/29/2016  . 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 APPENDECTOMY    . CESAREAN SECTION    . INSERT / REPLACE / REMOVE PACEMAKER    . MITRAL VALVE REPLACEMENT       OB History   No obstetric history on file.     Family History  Problem Relation Age of Onset  . Heart disease Mother        angina  . Arrhythmia Sister        Pacer    Social History   Tobacco Use  . Smoking status: Former Smoker    Packs/day: 2.00    Years: 10.00    Pack years: 20.00    Types: Cigarettes    Quit date: 1980    Years since quitting: 41.3  . Smokeless tobacco: Never Used  Substance Use Topics  . Alcohol use: No    Alcohol/week: 0.0 standard drinks    Comment: 1 glass of wine every few months.  . Drug use: No    Home Medications Prior to Admission medications   Medication Sig Start Date End Date  Taking? Authorizing Provider  amoxicillin (AMOXIL) 500 MG tablet Take 4 tablets (2 g) 30-60 minutes prior to dental appointment/prodedure 07/01/18   Marinus Maw, MD  aspirin EC 81 MG tablet Take 1 tablet (81 mg total) by mouth daily. 04/02/17   End, Cristal Deer, MD  atorvastatin (LIPITOR) 40 MG tablet Take 1 tablet by mouth daily. 11/02/16   [provider]  carvedilol (COREG) 6.25 MG tablet Take 1 tablet (6.25 mg total) by mouth 2 (two) times daily. 09/03/17   End, Cristal Deer, MD  levETIRAcetam (KEPPRA) 750 MG tablet Take 1 tablet (750 mg total) by mouth 2 (two) times daily. 09/27/19  10/27/19  Gracy Bruins, MD  lisinopril (PRINIVIL,ZESTRIL) 5 MG tablet Take 1 tablet (5 mg total) by mouth daily. 09/03/17   End, Cristal Deer, MD  metFORMIN (GLUCOPHAGE-XR) 500 MG 24 hr tablet Take 500 mg by mouth daily. 06/19/16   [provider]  spironolactone (ALDACTONE) 25 MG tablet Take 25 mg by mouth as directed. MWF    [provider]  traMADol (ULTRAM) 50 MG tablet Take 1 tablet (50 mg total) by mouth every 6 (six) hours as needed for moderate pain. 08/15/17   Gilda Crease, MD  warfarin (COUMADIN) 7.5 MG tablet Take as directed by the coumadin clinic 06/09/19   Marinus Maw, MD  zolpidem (AMBIEN) 10 MG tablet Take 10 mg by mouth at bedtime.    [provider]    Allergies    Patient has no known allergies.  Review of Systems   Review of Systems  Constitutional: Negative for activity change, appetite change, chills, diaphoresis, fatigue and fever.  HENT: Negative for congestion and rhinorrhea.   Respiratory: Negative for cough, shortness of breath and wheezing.   Cardiovascular: Negative for chest pain.  Gastrointestinal: Negative for abdominal distention, abdominal pain, diarrhea, nausea and vomiting.  Genitourinary: Negative for decreased urine volume, difficulty urinating, dysuria, frequency and urgency.  Musculoskeletal: Negative for gait problem.  Skin: Negative for color change and wound.  Neurological: Positive for facial asymmetry, speech difficulty and weakness. Negative for dizziness, syncope, light-headedness and headaches.  All other systems reviewed and are negative.   Physical Exam Updated Vital Signs BP (!) 116/49   Pulse 92   Temp 97.6 F (36.4 C) (Oral)   Resp (!) 21   Ht 5\' 3"  (1.6 m)   Wt 62.7 kg   SpO2 97%   BMI 24.49 kg/m   Physical Exam Vitals and nursing note reviewed.  Constitutional:      General: She is not in acute distress.    Appearance: Normal appearance. She is normal weight. She is not  ill-appearing.  HENT:     Head: Normocephalic.     Right Ear: External ear normal.     Left Ear: External ear normal.     Nose: Nose normal.     Mouth/Throat:     Mouth: Mucous membranes are moist.     Pharynx: Oropharynx is clear.  Eyes:     General: No visual field deficit.    Extraocular Movements: Extraocular movements intact.  Cardiovascular:     Rate and Rhythm: Normal rate and regular rhythm.     Pulses: Normal pulses.     Heart sounds: Normal heart sounds.  Pulmonary:     Effort: Pulmonary effort is normal. No respiratory distress.     Breath sounds: Normal breath sounds. No wheezing or rhonchi.  Abdominal:     General: Bowel sounds are normal.  Palpations: Abdomen is soft.     Tenderness: There is no abdominal tenderness. There is no guarding.  Musculoskeletal:     Cervical back: Normal range of motion.     Right lower leg: No edema.     Left lower leg: No edema.  Skin:    General: Skin is warm and dry.     Capillary Refill: Capillary refill takes less than 2 seconds.  Neurological:     Mental Status: She is alert and oriented to person, place, and time. Mental status is at baseline.     Cranial Nerves: Dysarthria and facial asymmetry present. No cranial nerve deficit.     Sensory: Sensation is intact.     Motor: Weakness and pronator drift present. No abnormal muscle tone.     Coordination: Coordination is intact. Coordination normal. Finger-Nose-Finger Test and Heel to [facility] Test normal.  Psychiatric:        Mood and Affect: Mood normal.     ED Results / Procedures / Treatments   Labs (all labs ordered are listed, but only abnormal results are displayed) Labs Reviewed  PROTIME-INR - Abnormal; Notable for the following components:      Result Value   Prothrombin Time 27.5 (*)    INR 2.6 (*)    All other components within normal limits  CBC - Abnormal; Notable for the following components:   RBC 3.68 (*)    Hemoglobin 11.3 (*)    HCT 35.4 (*)    All  other components within normal limits  DIFFERENTIAL - Abnormal; Notable for the following components:   Neutro Abs 1.4 (*)    All other components within normal limits  COMPREHENSIVE METABOLIC PANEL - Abnormal; Notable for the following components:   CO2 19 (*)    Glucose, Bld 106 (*)    Creatinine, Ser 1.07 (*)    GFR calc non Af Amer 52 (*)    Anion gap 17 (*)    All other components within normal limits  BRAIN NATRIURETIC PEPTIDE - Abnormal; Notable for the following components:   B Natriuretic Peptide 115.8 (*)    All other components within normal limits  I-STAT CHEM 8, ED - Abnormal; Notable for the following components:   Sodium 134 (*)    BUN 24 (*)    Glucose, Bld 101 (*)    Hemoglobin 11.9 (*)    HCT 35.0 (*)    All other components within normal limits  APTT  URINALYSIS, ROUTINE W REFLEX MICROSCOPIC  CBG MONITORING, ED  TROPONIN I (HIGH SENSITIVITY)  TROPONIN I (HIGH SENSITIVITY)    EKG None  Radiology CT ANGIO HEAD W OR WO CONTRAST  Result Date: 09/27/2019 CLINICAL DATA:  74 year old female code stroke presentation with slurred speech and left side deficits. EXAM: CT ANGIOGRAPHY HEAD AND NECK TECHNIQUE: Multidetector CT imaging of the head and neck was performed using the standard protocol during bolus administration of intravenous contrast. Multiplanar CT image reconstructions and MIPs were obtained to evaluate the vascular anatomy. Carotid stenosis measurements (when applicable) are obtained utilizing NASCET criteria, using the distal internal carotid diameter as the denominator. CONTRAST:  55mL OMNIPAQUE IOHEXOL 300 MG/ML  SOLN COMPARISON:  Plain head CT 1454 hours today. FINDINGS: CTA NECK Skeleton: Prior sternotomy. Mild for age spine degeneration. Upper chest: Left side cardiac pacemaker type device. Pulmonary artery dominant contrast timing. Motion artifact in the chest and lungs with streak artifact, but suspicious low-density filling defect and right upper lobe  pulmonary artery branches (series  8, image 160) suggesting acute pulmonary embolus. Some similar appearance of the left upper lobe branches. No upper lung opacity. No superior mediastinal lymphadenopathy. Other neck: No acute findings. Aortic arch: Suboptimal aortic arch contrast timing. Calcified aortic atherosclerosis. 3 vessel arch configuration. Right carotid system: Improved arterial contrast timing toward the upper neck. Minimal calcified plaque at the medial right ICA origin, no stenosis. Left carotid system: Minimal calcified plaque left ICA origin. Tortuosity without stenosis. Vertebral arteries: Calcified plaque at the right subclavian artery origin and motion artifact. No definite stenosis. Normal right vertebral artery origin. Patent and normal right vertebral to the skull base. Proximal origin of the left vertebral artery with mild calcified plaque but no significant stenosis suspected (series 11, image 149). The left vertebral is non dominant but appears patent to the skull base without significant stenosis. CTA HEAD Posterior circulation: Dominant right vertebral artery V4 segment, the left V4 is diminutive beyond PICA. No distal vertebral stenosis. Both PICA origins appear patent. Patent vertebrobasilar junction and basilar artery without stenosis. Patent SCA and PCA origins. Posterior communicating arteries are diminutive or absent. Bilateral PCA branches are within normal limits. Anterior circulation: Both ICA siphons are patent. Mild calcified plaque without stenosis on the left. Similar mild calcified plaque without stenosis on the right. Patent carotid termini. Normal MCA and ACA origins. Anterior communicating artery and bilateral ACA branches are within normal limits. Left MCA M1 segment and bifurcation are patent without stenosis. Left MCA branches are within normal limits. Right MCA M1 segment and bifurcation are patent without stenosis. There is a paucity of right MCA middle sylvian  branches which is likely chronic, associated with encephalomalacia. Otherwise right MCA branches are within normal limits. Venous sinuses: Early contrast timing, not well evaluated. Anatomic variants: Dominant right vertebral artery. Review of the MIP images confirms the above findings IMPRESSION: 1. Positive for evidence of bilateral upper lobe Acute Pulmonary Emboli. 2. Negative for large vessel occlusion. 3. Mild for age atherosclerosis and no hemodynamically significant arterial stenosis in the head or neck. 4. Asymmetric decreased number of Right MCA middle sylvian branches associated with chronic infarct and encephalomalacia. 5. Aortic Atherosclerosis (ICD10-I70.0). Preliminary results of the above These results were communicated to Dr. Laurence Slate at 1534 hours by text page via the [facility] messaging system. Electronically Signed   By: Odessa Fleming M.D.   On: 09/27/2019 15:37   CT ANGIO NECK W OR WO CONTRAST  Result Date: 09/27/2019 CLINICAL DATA:  73 year old female code stroke presentation with slurred speech and left side deficits. EXAM: CT ANGIOGRAPHY HEAD AND NECK TECHNIQUE: Multidetector CT imaging of the head and neck was performed using the standard protocol during bolus administration of intravenous contrast. Multiplanar CT image reconstructions and MIPs were obtained to evaluate the vascular anatomy. Carotid stenosis measurements (when applicable) are obtained utilizing NASCET criteria, using the distal internal carotid diameter as the denominator. CONTRAST:  75mL OMNIPAQUE IOHEXOL 300 MG/ML  SOLN COMPARISON:  Plain head CT 1454 hours today. FINDINGS: CTA NECK Skeleton: Prior sternotomy. Mild for age spine degeneration. Upper chest: Left side cardiac pacemaker type device. Pulmonary artery dominant contrast timing. Motion artifact in the chest and lungs with streak artifact, but suspicious low-density filling defect and right upper lobe pulmonary artery branches (series 8, image 160) suggesting acute  pulmonary embolus. Some similar appearance of the left upper lobe branches. No upper lung opacity. No superior mediastinal lymphadenopathy. Other neck: No acute findings. Aortic arch: Suboptimal aortic arch contrast timing. Calcified aortic atherosclerosis. 3 vessel arch  configuration. Right carotid system: Improved arterial contrast timing toward the upper neck. Minimal calcified plaque at the medial right ICA origin, no stenosis. Left carotid system: Minimal calcified plaque left ICA origin. Tortuosity without stenosis. Vertebral arteries: Calcified plaque at the right subclavian artery origin and motion artifact. No definite stenosis. Normal right vertebral artery origin. Patent and normal right vertebral to the skull base. Proximal origin of the left vertebral artery with mild calcified plaque but no significant stenosis suspected (series 11, image 149). The left vertebral is non dominant but appears patent to the skull base without significant stenosis. CTA HEAD Posterior circulation: Dominant right vertebral artery V4 segment, the left V4 is diminutive beyond PICA. No distal vertebral stenosis. Both PICA origins appear patent. Patent vertebrobasilar junction and basilar artery without stenosis. Patent SCA and PCA origins. Posterior communicating arteries are diminutive or absent. Bilateral PCA branches are within normal limits. Anterior circulation: Both ICA siphons are patent. Mild calcified plaque without stenosis on the left. Similar mild calcified plaque without stenosis on the right. Patent carotid termini. Normal MCA and ACA origins. Anterior communicating artery and bilateral ACA branches are within normal limits. Left MCA M1 segment and bifurcation are patent without stenosis. Left MCA branches are within normal limits. Right MCA M1 segment and bifurcation are patent without stenosis. There is a paucity of right MCA middle sylvian branches which is likely chronic, associated with encephalomalacia.  Otherwise right MCA branches are within normal limits. Venous sinuses: Early contrast timing, not well evaluated. Anatomic variants: Dominant right vertebral artery. Review of the MIP images confirms the above findings IMPRESSION: 1. Positive for evidence of bilateral upper lobe Acute Pulmonary Emboli. 2. Negative for large vessel occlusion. 3. Mild for age atherosclerosis and no hemodynamically significant arterial stenosis in the head or neck. 4. Asymmetric decreased number of Right MCA middle sylvian branches associated with chronic infarct and encephalomalacia. 5. Aortic Atherosclerosis (ICD10-I70.0). Preliminary results of the above These results were communicated to Dr. Laurence Slate at 1534 hours by text page via the [facility] messaging system. Electronically Signed   By: Odessa Fleming M.D.   On: 09/27/2019 15:37   CT Angio Chest PE W and/or Wo Contrast  Result Date: 09/27/2019 CLINICAL DATA:  74 year old female with shortness of breath. EXAM: CT ANGIOGRAPHY CHEST WITH CONTRAST TECHNIQUE: Multidetector CT imaging of the chest was performed using the standard protocol during bolus administration of intravenous contrast. Multiplanar CT image reconstructions and MIPs were obtained to evaluate the vascular anatomy. CONTRAST:  75mL OMNIPAQUE IOHEXOL 350 MG/ML SOLN COMPARISON:  Chest radiograph dated 09/27/2019. FINDINGS: Evaluation is limited due to streak artifact caused by metallic pacemaker device and dense contrast in the right subclavian vein. Cardiovascular: There is moderate to severe cardiomegaly with biatrial enlargement. Mechanical mitral valve and left pectoral pacemaker device. No pericardial effusion. There is mild atherosclerotic calcification of the thoracic aorta. Evaluation of the aorta is limited due to streak artifact and suboptimal opacification. There is dilatation of the main pulmonary trunk suggestive of pulmonary hypertension. No pulmonary artery embolus identified. Mediastinum/Nodes: There is no hilar  or mediastinal adenopathy. The esophagus is grossly unremarkable. No mediastinal fluid collection. Lungs/Pleura: Bibasilar linear atelectasis/scarring. Patchy area of hazy density in the lingula noted which may represent atelectasis but concerning for infiltrate. Additional scattered confluent hazy pulmonary densities may represent atelectasis or infiltrate. No lobar consolidation, pleural effusion, or pneumothorax. Several scattered nodules with the largest measuring approximately 4 mm along the minor fissure (70/80). A cluster of nodular density in the right  upper lobe (59/80) demonstrate tree-in-bud appearance and possibly inflammatory/infectious in etiology. The central airways are patent. Upper Abdomen: No acute abnormality. Musculoskeletal: No acute osseous pathology. Median sternotomy wires. Review of the MIP images confirms the above findings. IMPRESSION: 1. No CT evidence of pulmonary artery embolus. 2. Patchy area of hazy density in the lingula may represent atelectasis but concerning for infiltrate. A cluster of nodular density in the right upper lobe with tree-in-bud appearance may be inflammatory/infectious in etiology. Clinical correlation and follow-up to resolution recommended. 3. Moderate to severe cardiomegaly with biatrial enlargement. 4. Dilatation of the main pulmonary trunk suggestive of pulmonary hypertension. 5. Aortic Atherosclerosis (ICD10-I70.0). Electronically Signed   By: Elgie Collard M.D.   On: 09/27/2019 18:49   DG Chest Portable 1 View  Result Date: 09/27/2019 CLINICAL DATA:  Possible seizure. EXAM: PORTABLE CHEST 1 VIEW COMPARISON:  August 14, 2016 FINDINGS: Stable cardiomegaly and pacemaker device. No pneumothorax. No nodules or masses. No focal infiltrates. No overt edema. IMPRESSION: No active disease. Electronically Signed   By: Gerome Sam III M.D   On: 09/27/2019 16:08   CT HEAD CODE STROKE WO CONTRAST  Result Date: 09/27/2019 CLINICAL DATA:  Code stroke.  74 year old female with slurred speech and left side deficits. EXAM: CT HEAD WITHOUT CONTRAST TECHNIQUE: Contiguous axial images were obtained from the base of the skull through the vertex without intravenous contrast. COMPARISON:  Head CT 03/12/2016. FINDINGS: Brain: Chronic right MCA territory infarct with encephalomalacia including in the right lentiform nuclei, stable since 2017. No midline shift, ventriculomegaly, mass effect, evidence of mass lesion, intracranial hemorrhage or evidence of cortically based acute infarction. Vascular: Mild Calcified atherosclerosis at the skull base. No suspicious intracranial vascular hyperdensity. Skull: No acute osseous abnormality identified. Sinuses/Orbits: Visualized paranasal sinuses and mastoids are stable and well pneumatized. Other: Mild leftward gaze. Visualized scalp soft tissues are within normal limits. ASPECTS [facility] Stroke Program Early CT Score) Total score (0-10 with 10 being normal): 10 (chronic encephalomalacia). IMPRESSION: 1. No acute intracranial hemorrhage or cortically based infarct identified. 2. Chronic right MCA territory infarct.  ASPECTS 10. 3. These results were communicated to Dr. Laurence Slate at 3:07 pm on 4/10/2021by text page via the [facility] messaging system. Electronically Signed   By: Odessa Fleming M.D.   On: 09/27/2019 15:08   VAS [place] LOWER EXTREMITY VENOUS (DVT) (ONLY [facility] & [facility])  Result Date: 09/27/2019  Lower Venous DVTStudy Indications: Swelling.  Comparison Study: No prior study on file Performing Technologist: Sherren Kerns RVS  Examination Guidelines: A complete evaluation includes B-mode imaging, spectral Doppler, color Doppler, and power Doppler as needed of all accessible portions of each vessel. Bilateral testing is considered an integral part of a complete examination. Limited examinations for reoccurring indications may be performed as noted. The reflux portion of the exam is performed with the patient in reverse Trendelenburg.   +-----+---------------+---------+-----------+----------+--------------+ RIGHTCompressibilityPhasicitySpontaneityPropertiesThrombus Aging +-----+---------------+---------+-----------+----------+--------------+ CFV  Full           Yes      Yes                                 +-----+---------------+---------+-----------+----------+--------------+   +---------+---------------+---------+-----------+----------+--------------+ LEFT     CompressibilityPhasicitySpontaneityPropertiesThrombus Aging +---------+---------------+---------+-----------+----------+--------------+ CFV      Full           Yes      Yes                                 +---------+---------------+---------+-----------+----------+--------------+  SFJ      Full                                                        +---------+---------------+---------+-----------+----------+--------------+ FV Prox  Full                                                        +---------+---------------+---------+-----------+----------+--------------+ FV Mid   Full                                                        +---------+---------------+---------+-----------+----------+--------------+ FV DistalFull                                                        +---------+---------------+---------+-----------+----------+--------------+ PFV      Full                                                        +---------+---------------+---------+-----------+----------+--------------+ POP      Full           Yes      Yes                                 +---------+---------------+---------+-----------+----------+--------------+ PTV      Full                                                        +---------+---------------+---------+-----------+----------+--------------+ PERO     Full                                                         +---------+---------------+---------+-----------+----------+--------------+     Summary: RIGHT: - No evidence of common femoral vein obstruction.  LEFT: - There is no evidence of deep vein thrombosis in the lower extremity.  *See table(s) above for measurements and observations.    Preliminary     Procedures Procedures (including critical care time)  Medications Ordered in ED Medications  sodium chloride flush (NS) 0.9 % injection 3 mL (has no administration in time range)  iohexol (OMNIPAQUE) 300 MG/ML solution 75 mL (75 mLs Intravenous Contrast Given 09/27/19 1520)  levETIRAcetam (KEPPRA) IVPB 1000 mg/100 mL premix (0 mg Intravenous Stopped 09/27/19 1754)  iohexol (OMNIPAQUE) 350 MG/ML injection 75 mL (75 mLs  Intravenous Contrast Given 09/27/19 1834)    ED Course  I have reviewed the triage vital signs and the nursing notes.  Pertinent labs & imaging results that were available during my care of the patient were reviewed by me and considered in my medical decision making (see chart for details).    MDM Rules/Calculators/A&P                      74 year old female with past medical history of atrial fibrillation on coumadin, pacemaker in place, HLD, seizure disorder on keppra, previous CVA presenting department brought in by EMS as a code stroke complaining of L sided weakness, facial droop, slurred speech.  Differential diagnoses considered include CVA, seizure with associated Tod's paralysis that may be resolving upon arrival, electrolyte derangement, anemia, occult infection.  Initial interventions, patient taken immediately to the CT scanner with neurology quickly at bedside for evaluation.  Point-of-care glucose 95. Loaded with keppra 1000 mg.  ECG interpreted by me demonstrated Accelerated junctional rhythm at 71 bpm, nonspecific IVCD, minimal ST depression in the inferior leads otherwise without overt ST-T wave changes suggestive of acute ischemia, overall similar EKG compared to  previous on 08/14/2017   CT head code stroke demonstrated no acute intracranial hemorrhage or cortical-based infarct, chronic right MCA territory infarct  CT angio head demonstrated evidence for bilateral upper lobe acute pulmonary emboli, no large vessel occlusion, aortic atherosclerosis, chronic infarct encephalomalacia and neck demonstrated evidence of bilateral upper lobe pulmonary emboli without large vessel occlusion.  We will investigate bilateral upper lobe pulmonary emboli further. Per neurology, no contraindication to anticoagulation. Will discuss further with hematology. Will obtain a CTA PE study.   Labs demonstrated PT 27.5 and INR 2.6, APTT 35, no significant leukocytosis, normocytic anemia with a hemoglobin 11.3 and MCV of 96.2, platelets 160, minimal hyponatremia to 134, BUN slightly elevated 24 with a creatinine of 1 otherwise no significant arrangement on i-STAT Chem-8, CMP with diminished bicarb to 19 and creatinine slightly elevated to 1.07 up from previous of 0.89, AG 17, initial troponin 7, BNP 115.8  CXR interpreted by me demonstrated no acute cardiopulmonary processes.  Upon reassessment patient's ABCs are intact and vital signs are stable.  Discussed with hematology oncology who reported no contraindication to anticoagulation.  Will confirm PE burden with CTA PE and subsequently anticoagulate.  Patient will be admitted for ongoing seizure evaluation and management in conjunction with neurology and hematology.  CTA PE study demonstrated no acute pulmonary emboli, moderate to severe cardiomegaly with biatrial enlargement and dilation of the main pulmonary trunk suggestive of pulmonary hypertension, aortic atherosclerosis also noted.  Atelectasis of the lingula, tree-in-bud appearance of the right upper lobe is inflammatory versus infectious in etiology.  Left lower extremity duplex study was negative for DVT. Overall this eliminates my concern for VTE at this time.   Discussed  with neurology who recommended discharge with dose adjustment with patient's Keppra to 750 mg twice daily and outpatient follow-up.  Using a tele interpreter, updated the patient and family on her findings with the CT imaging.  I discussed the medication adjustment for the patient's Keppra with her.  The patient reported that she is not been taking her Keppra dose as prescribed (500 mg twice daily) but has instead been taking one 500 mg tablet daily.  I will recommend that the patient begin taking the Keppra 500 mg twice daily as recommended originally.  Patient expressed understanding and agreement this plan.  Feel the patient safe  and stable for discharge at this time with return precautions and plan for follow-up care in place.  The plan for this patient was discussed with Dr. Silverio Lay, who voiced agreement and who oversaw evaluation and treatment of this patient.  Final Clinical Impression(s) / ED Diagnoses Final diagnoses:  Seizure [facility])    Rx / DC Orders ED Discharge Orders         Ordered    levETIRAcetam (KEPPRA) 750 MG tablet  2 times daily     09/27/19 1945           Gracy Bruins, MD 09/27/19 1948    Gracy Bruins, MD 09/27/19 2008    Charlynne Pander, MD 09/28/19 [phone]

## 2019-09-27 NOTE — ED Notes (Signed)
Verbalized understanding of DC instructions, Rx, follow up care 

## 2019-09-27 NOTE — Progress Notes (Signed)
VASCULAR LAB PRELIMINARY  PRELIMINARY  PRELIMINARY  PRELIMINARY  Left lower extremity venous duplex completed.    Preliminary report:  See CV proc for preliminary results.   Ida Milbrath, RVT 09/27/2019, 6:36 PM

## 2019-09-27 NOTE — Progress Notes (Signed)
Code Stroke Documentation  Code stroke activated at 1437 via EMS. Pt from home where family reported her to have new onset of slurred speech and left sided weakness. LKW 1400. Pt has a history of CVA, seizures. Currently on Coumadin, last taken this morning. Pt noted to have partial hemianopia, minor left facial paralysis, and bilateral lower extremity drift. NIH 5. CT and CTA performed. CTA showed no LVO. No TPA given.    Jennye Moccasin, RN  Rapid Response RN 4:09 PM 09/27/2019

## 2019-09-27 NOTE — Consult Note (Addendum)
NEURO HOSPITALIST  CONSULT   Requesting Physician: Dr. Silverio Lay    Chief Complaint:  Left side weakness  History obtained from:  EMS HPI:                                                                                                                                         Pamela Walker is an 74 y.o. female  With PMH seizure, Stroke (hemorrhagic 2017), chronic a fib (on coumadin), PPM who presented to [facility] ED as a code stroke for c/o left side weakness, slurred speech.    Per EMS LKW was 1400: her family noticed that suddenly patient had slurred speech, lethargy and left side weakness. When EMS arrived  They noted significant left side weakness slurred speech and word finding difficulty.  ED course:  CTH: no hemorrhage CTA: no LVO BP: 129/63 BG: 101 tPA Given: No: on coumadin  Modified Rankin: Rankin Score=0 NIHSS:5 ( droop, drift x2,questions, visual)    Past Medical History:  Diagnosis Date  . Atrial fibrillation (HCC)    on coumadin  . DM (diabetes mellitus) (HCC)   . Hyperlipidemia   . Rheumatic fever/heart disease   . Seizure (HCC)   . Stroke [facility]) 2017   [facility]    Past Surgical History:  Procedure Laterality Date  . APPENDECTOMY    . CESAREAN SECTION    . INSERT / REPLACE / REMOVE PACEMAKER    . MITRAL VALVE REPLACEMENT      Family History  Problem Relation Age of Onset  . Heart disease Mother        angina  . Arrhythmia Sister        Pacer     Social History:  reports that she quit smoking about 41 years ago. Her smoking use included cigarettes. She has a 20.00 pack-year smoking history. She has never used smokeless tobacco. She reports that she does not drink alcohol or use drugs.  Allergies: No Known Allergies  Medications:  Current Facility-Administered Medications  Medication Dose Route  Frequency Provider Last Rate Last Admin  . sodium chloride flush (NS) 0.9 % injection 3 mL  3 mL Intravenous Once Charlynne Pander, MD       Current Outpatient Medications  Medication Sig Dispense Refill  . amoxicillin (AMOXIL) 500 MG tablet Take 4 tablets (2 g) 30-60 minutes prior to dental appointment/prodedure 4 tablet 3  . aspirin EC 81 MG tablet Take 1 tablet (81 mg total) by mouth daily.    Marland Kitchen atorvastatin (LIPITOR) 40 MG tablet Take 1 tablet by mouth daily.    . carvedilol (COREG) 6.25 MG tablet Take 1 tablet (6.25 mg total) by mouth 2 (two) times daily. 180 tablet 3  . levETIRAcetam (KEPPRA) 500 MG tablet Take 500 mg by mouth 2 (two) times daily.    Marland Kitchen lisinopril (PRINIVIL,ZESTRIL) 5 MG tablet Take 1 tablet (5 mg total) by mouth daily. 90 tablet 2  . metFORMIN (GLUCOPHAGE-XR) 500 MG 24 hr tablet Take 500 mg by mouth daily.    Marland Kitchen spironolactone (ALDACTONE) 25 MG tablet Take 25 mg by mouth as directed. MWF    . traMADol (ULTRAM) 50 MG tablet Take 1 tablet (50 mg total) by mouth every 6 (six) hours as needed for moderate pain. 15 tablet 0  . warfarin (COUMADIN) 7.5 MG tablet Take as directed by the coumadin clinic 110 tablet 1  . zolpidem (AMBIEN) 10 MG tablet Take 10 mg by mouth at bedtime.       ROS:                                                                                                                                       ROS was performed and is negative except as noted in HPI    General Examination:                                                                                                      There were no vitals taken for this visit.  Physical Exam  Constitutional: Appears well-developed and well-nourished.  Psych: Affect appropriate to situation Eyes: Normal external eye and conjunctiva. HENT: Normocephalic, no lesions, without obvious abnormality.   Musculoskeletal-no joint tenderness, deformity or swelling Cardiovascular: Normal rate and regular rhythm.   Respiratory: Effort normal, non-labored breathing saturations WNL on RA GI: Soft.  No distension. There is no tenderness.  Skin: WDI  Neurological Examination Mental Status: Alert, oriented name/age/ not month, thought content appropriate.  Speech fluent  without evidence of aphasia.  Able to follow 3 step commands without difficulty. Cranial Nerves: II: superior division field cut quadrantaopia III,IV, VI: ptosis not present, extra-ocular motions intact bilaterally, pupils equal, round, reactive to light and  V,VII: smile asymmetric, left facial droop, facial light touch sensation normal bilaterally VIII: hearing normal bilaterally IX,X: uvula rises midline XI: bilateral shoulder shrug XII: midline tongue extension Motor: Right : Upper extremity   5/5 Left:     Upper extremity   5/5  Lower extremity   5/5  Lower extremity   5/5 Tone and bulk:normal tone throughout; no atrophy noted Sensory: light touch intact throughout, bilaterally Deep Tendon Reflexes: 2+ and symmetric biceps and patella Cerebellar: No ataxia noted Gait: deferred   Lab Results: Basic Metabolic Panel: Recent Labs  Lab 09/27/19 1452  NA 134*  K 4.7  CL 100  GLUCOSE 101*  BUN 24*  CREATININE 1.00    CBC: Recent Labs  Lab 09/27/19 1452  HGB 11.9*  HCT 35.0*    CBG: Recent Labs  Lab 09/27/19 1446  GLUCAP 95    Imaging: CT ANGIO HEAD W OR WO CONTRAST  Result Date: 09/27/2019 CLINICAL DATA:  74 year old female code stroke presentation with slurred speech and left side deficits. EXAM: CT ANGIOGRAPHY HEAD AND NECK TECHNIQUE: Multidetector CT imaging of the head and neck was performed using the standard protocol during bolus administration of intravenous contrast. Multiplanar CT image reconstructions and MIPs were obtained to evaluate the vascular anatomy. Carotid stenosis measurements (when applicable) are obtained utilizing NASCET criteria, using the distal internal carotid diameter as the  denominator. CONTRAST:  53mL OMNIPAQUE IOHEXOL 300 MG/ML  SOLN COMPARISON:  Plain head CT 1454 hours today. FINDINGS: CTA NECK Skeleton: Prior sternotomy. Mild for age spine degeneration. Upper chest: Left side cardiac pacemaker type device. Pulmonary artery dominant contrast timing. Motion artifact in the chest and lungs with streak artifact, but suspicious low-density filling defect and right upper lobe pulmonary artery branches (series 8, image 160) suggesting acute pulmonary embolus. Some similar appearance of the left upper lobe branches. No upper lung opacity. No superior mediastinal lymphadenopathy. Other neck: No acute findings. Aortic arch: Suboptimal aortic arch contrast timing. Calcified aortic atherosclerosis. 3 vessel arch configuration. Right carotid system: Improved arterial contrast timing toward the upper neck. Minimal calcified plaque at the medial right ICA origin, no stenosis. Left carotid system: Minimal calcified plaque left ICA origin. Tortuosity without stenosis. Vertebral arteries: Calcified plaque at the right subclavian artery origin and motion artifact. No definite stenosis. Normal right vertebral artery origin. Patent and normal right vertebral to the skull base. Proximal origin of the left vertebral artery with mild calcified plaque but no significant stenosis suspected (series 11, image 149). The left vertebral is non dominant but appears patent to the skull base without significant stenosis. CTA HEAD Posterior circulation: Dominant right vertebral artery V4 segment, the left V4 is diminutive beyond PICA. No distal vertebral stenosis. Both PICA origins appear patent. Patent vertebrobasilar junction and basilar artery without stenosis. Patent SCA and PCA origins. Posterior communicating arteries are diminutive or absent. Bilateral PCA branches are within normal limits. Anterior circulation: Both ICA siphons are patent. Mild calcified plaque without stenosis on the left. Similar mild  calcified plaque without stenosis on the right. Patent carotid termini. Normal MCA and ACA origins. Anterior communicating artery and bilateral ACA branches are within normal limits. Left MCA M1 segment and bifurcation are patent without stenosis. Left MCA branches are within normal limits. Right MCA M1 segment  and bifurcation are patent without stenosis. There is a paucity of right MCA middle sylvian branches which is likely chronic, associated with encephalomalacia. Otherwise right MCA branches are within normal limits. Venous sinuses: Early contrast timing, not well evaluated. Anatomic variants: Dominant right vertebral artery. Review of the MIP images confirms the above findings IMPRESSION: 1. Positive for evidence of bilateral upper lobe Acute Pulmonary Emboli. 2. Negative for large vessel occlusion. 3. Mild for age atherosclerosis and no hemodynamically significant arterial stenosis in the head or neck. 4. Asymmetric decreased number of Right MCA middle sylvian branches associated with chronic infarct and encephalomalacia. 5. Aortic Atherosclerosis (ICD10-I70.0). Preliminary results of the above These results were communicated to Dr. Laurence Slate at 1534 hours by text page via the [facility] messaging system. Electronically Signed   By: Odessa Fleming M.D.   On: 09/27/2019 15:37   CT ANGIO NECK W OR WO CONTRAST  Result Date: 09/27/2019 CLINICAL DATA:  74 year old female code stroke presentation with slurred speech and left side deficits. EXAM: CT ANGIOGRAPHY HEAD AND NECK TECHNIQUE: Multidetector CT imaging of the head and neck was performed using the standard protocol during bolus administration of intravenous contrast. Multiplanar CT image reconstructions and MIPs were obtained to evaluate the vascular anatomy. Carotid stenosis measurements (when applicable) are obtained utilizing NASCET criteria, using the distal internal carotid diameter as the denominator. CONTRAST:  50mL OMNIPAQUE IOHEXOL 300 MG/ML  SOLN COMPARISON:   Plain head CT 1454 hours today. FINDINGS: CTA NECK Skeleton: Prior sternotomy. Mild for age spine degeneration. Upper chest: Left side cardiac pacemaker type device. Pulmonary artery dominant contrast timing. Motion artifact in the chest and lungs with streak artifact, but suspicious low-density filling defect and right upper lobe pulmonary artery branches (series 8, image 160) suggesting acute pulmonary embolus. Some similar appearance of the left upper lobe branches. No upper lung opacity. No superior mediastinal lymphadenopathy. Other neck: No acute findings. Aortic arch: Suboptimal aortic arch contrast timing. Calcified aortic atherosclerosis. 3 vessel arch configuration. Right carotid system: Improved arterial contrast timing toward the upper neck. Minimal calcified plaque at the medial right ICA origin, no stenosis. Left carotid system: Minimal calcified plaque left ICA origin. Tortuosity without stenosis. Vertebral arteries: Calcified plaque at the right subclavian artery origin and motion artifact. No definite stenosis. Normal right vertebral artery origin. Patent and normal right vertebral to the skull base. Proximal origin of the left vertebral artery with mild calcified plaque but no significant stenosis suspected (series 11, image 149). The left vertebral is non dominant but appears patent to the skull base without significant stenosis. CTA HEAD Posterior circulation: Dominant right vertebral artery V4 segment, the left V4 is diminutive beyond PICA. No distal vertebral stenosis. Both PICA origins appear patent. Patent vertebrobasilar junction and basilar artery without stenosis. Patent SCA and PCA origins. Posterior communicating arteries are diminutive or absent. Bilateral PCA branches are within normal limits. Anterior circulation: Both ICA siphons are patent. Mild calcified plaque without stenosis on the left. Similar mild calcified plaque without stenosis on the right. Patent carotid termini. Normal  MCA and ACA origins. Anterior communicating artery and bilateral ACA branches are within normal limits. Left MCA M1 segment and bifurcation are patent without stenosis. Left MCA branches are within normal limits. Right MCA M1 segment and bifurcation are patent without stenosis. There is a paucity of right MCA middle sylvian branches which is likely chronic, associated with encephalomalacia. Otherwise right MCA branches are within normal limits. Venous sinuses: Early contrast timing, not well evaluated. Anatomic variants: Dominant  right vertebral artery. Review of the MIP images confirms the above findings IMPRESSION: 1. Positive for evidence of bilateral upper lobe Acute Pulmonary Emboli. 2. Negative for large vessel occlusion. 3. Mild for age atherosclerosis and no hemodynamically significant arterial stenosis in the head or neck. 4. Asymmetric decreased number of Right MCA middle sylvian branches associated with chronic infarct and encephalomalacia. 5. Aortic Atherosclerosis (ICD10-I70.0). Preliminary results of the above These results were communicated to Dr. Laurence Slate at 1534 hours by text page via the [facility] messaging system. Electronically Signed   By: Odessa Fleming M.D.   On: 09/27/2019 15:37   CT HEAD CODE STROKE WO CONTRAST  Result Date: 09/27/2019 CLINICAL DATA:  Code stroke. 74 year old female with slurred speech and left side deficits. EXAM: CT HEAD WITHOUT CONTRAST TECHNIQUE: Contiguous axial images were obtained from the base of the skull through the vertex without intravenous contrast. COMPARISON:  Head CT 03/12/2016. FINDINGS: Brain: Chronic right MCA territory infarct with encephalomalacia including in the right lentiform nuclei, stable since 2017. No midline shift, ventriculomegaly, mass effect, evidence of mass lesion, intracranial hemorrhage or evidence of cortically based acute infarction. Vascular: Mild Calcified atherosclerosis at the skull base. No suspicious intracranial vascular hyperdensity.  Skull: No acute osseous abnormality identified. Sinuses/Orbits: Visualized paranasal sinuses and mastoids are stable and well pneumatized. Other: Mild leftward gaze. Visualized scalp soft tissues are within normal limits. ASPECTS [facility] Stroke Program Early CT Score) Total score (0-10 with 10 being normal): 10 (chronic encephalomalacia). IMPRESSION: 1. No acute intracranial hemorrhage or cortically based infarct identified. 2. Chronic right MCA territory infarct.  ASPECTS 10. 3. These results were communicated to Dr. Laurence Slate at 3:07 pm on 4/10/2021by text page via the [facility] messaging system. Electronically Signed   By: Odessa Fleming M.D.   On: 09/27/2019 15:08       Valentina Lucks, MSN, NP-C [facility] [phone]  09/27/2019, 2:55 PM   Attending physician note to follow with Assessment and plan .    NEUROHOSPITALIST ADDENDUM Performed a face to face diagnostic evaluation.   I have reviewed the contents of history and physical exam as documented by PA/ARNP/Resident and agree with above documentation.  I have discussed and formulated the above plan as documented. Edits to the note have been made as needed.      Assessment: 74 y.o. female  With PMH seizure, Stroke (hemorrhagic 2017), chronic a fib (on coumadin), PPM who presented to [facility] ED as a code stroke for c/o left side weakness, slurred speech. She was not a candidate for TPA given d/t coumadin. CTA showed no LVO.  Patient exam is rapidly improving, but still with some neurological deficits. Given the description this could be a seizure, but TIA remains on the differential.  Patient can not have an MRI given her pacemaker.  Stroke Risk Factors - atrial fibrillation and diabetes mellitus   Spoke to the husband after he arrived, states that they were in a car and patient suddenly started to feel dizzy.  Patient remembers feeling dizzy and having difficulty speaking and then passing out.  She does not remember anything else until  ambulance arrived.  According to husband, patient had passed out completely.  Teeth were clenched and patient did have a small tongue bite.  No posturing.  Gaze deviation.  Given prior history of right MCA stroke with encephalomalacia, history of seizure, current presentation of left-sided weakness which rapidly resolved associated with loss of consciousness, and normal INR  ( 2.6) -less likely to be TIA more  likely to be a seizure.  Seizure versus less likely TIA  Recommendations: --UA/ BMP, infectious work up --INR 2.6 --Unable to get MRI brain, echocardiogram will unlikely change management as patient on Coumadin and therapeutic, CTA negative for large vessel occlusion-significant stenosis.  --Possible PE, will defer to ED for evaluation management although patient currently does not complain of symptoms --IF no provoking factor for seizure may consider increasing Keppra to 750 twice daily.     Karena Addison Paizlee Kinder MD [facility] [phone]   If 7pm to 7am, please call on call as listed on AMION.

## 2019-09-27 NOTE — ED Notes (Signed)
Ambulatory to bathroom with stand by assist. Steady gait.

## 2019-09-27 NOTE — ED Notes (Signed)
Food and drink provided to patient

## 2019-09-27 NOTE — ED Triage Notes (Signed)
Pt brought to ED via EMS from home after family reported sudden slurred speech, lethargy, and left sided weakness. LKW 1400. HX of CVA, seizures. Ventricular pacemaker implanted. Currently on coumadin, last taken this AM. Primarily spanish speaking, limited english. Pt transported to CT 1.

## 2019-10-06 ENCOUNTER — Ambulatory Visit: Payer: Medicare Other | Admitting: *Deleted

## 2019-10-06 ENCOUNTER — Other Ambulatory Visit: Payer: Self-pay

## 2019-10-06 DIAGNOSIS — Z5181 Encounter for therapeutic drug level monitoring: Secondary | ICD-10-CM

## 2019-10-06 DIAGNOSIS — Z952 Presence of prosthetic heart valve: Secondary | ICD-10-CM | POA: Diagnosis not present

## 2019-10-06 DIAGNOSIS — I4891 Unspecified atrial fibrillation: Secondary | ICD-10-CM

## 2019-10-06 LAB — POCT INR: INR: 2.9 (ref 2.0–3.0)

## 2019-10-06 NOTE — Patient Instructions (Signed)
Description   Continue taking 1 tablet every day except 1.5 tablets on Tuesdays, Thursdays and Saturdays. Recheck INR in 5 weeks. [phone]

## 2019-11-10 ENCOUNTER — Other Ambulatory Visit: Payer: Self-pay

## 2019-11-10 ENCOUNTER — Ambulatory Visit (INDEPENDENT_AMBULATORY_CARE_PROVIDER_SITE_OTHER): Payer: Medicare Other | Admitting: *Deleted

## 2019-11-10 DIAGNOSIS — Z952 Presence of prosthetic heart valve: Secondary | ICD-10-CM

## 2019-11-10 DIAGNOSIS — I4891 Unspecified atrial fibrillation: Secondary | ICD-10-CM

## 2019-11-10 DIAGNOSIS — Z5181 Encounter for therapeutic drug level monitoring: Secondary | ICD-10-CM

## 2019-11-10 LAB — POCT INR: INR: 1.3 — AB (ref 2.0–3.0)

## 2019-11-10 NOTE — Patient Instructions (Signed)
Description   Take 1.5 tablets today and 2 tablets tomorrow then continue taking 1 tablet every day except 1.5 tablets on Tuesdays, Thursdays and Saturdays. Recheck INR in 1 week. [phone]

## 2019-11-19 ENCOUNTER — Other Ambulatory Visit: Payer: Self-pay

## 2019-11-19 ENCOUNTER — Encounter (INDEPENDENT_AMBULATORY_CARE_PROVIDER_SITE_OTHER): Payer: Self-pay

## 2019-11-19 ENCOUNTER — Ambulatory Visit: Payer: Medicare Other

## 2019-11-19 DIAGNOSIS — Z5181 Encounter for therapeutic drug level monitoring: Secondary | ICD-10-CM | POA: Diagnosis not present

## 2019-11-19 DIAGNOSIS — I4891 Unspecified atrial fibrillation: Secondary | ICD-10-CM

## 2019-11-19 DIAGNOSIS — Z952 Presence of prosthetic heart valve: Secondary | ICD-10-CM

## 2019-11-19 LAB — POCT INR: INR: 1.6 — AB (ref 2.0–3.0)

## 2019-11-19 NOTE — Patient Instructions (Signed)
Description   Take 2 tablets today and tomorrow, then start taking 1.5 tablets daily except 1 tablet on Mondays, Wednesdays and Fridays.  Recheck INR in 1 week. [phone]

## 2019-11-24 ENCOUNTER — Ambulatory Visit: Payer: Medicare Other

## 2019-11-24 ENCOUNTER — Other Ambulatory Visit: Payer: Self-pay

## 2019-11-24 ENCOUNTER — Encounter (INDEPENDENT_AMBULATORY_CARE_PROVIDER_SITE_OTHER): Payer: Self-pay

## 2019-11-24 DIAGNOSIS — Z5181 Encounter for therapeutic drug level monitoring: Secondary | ICD-10-CM

## 2019-11-24 DIAGNOSIS — I4891 Unspecified atrial fibrillation: Secondary | ICD-10-CM

## 2019-11-24 DIAGNOSIS — Z952 Presence of prosthetic heart valve: Secondary | ICD-10-CM

## 2019-11-24 LAB — POCT INR: INR: 3.2 — AB (ref 2.0–3.0)

## 2019-11-24 NOTE — Patient Instructions (Signed)
Description   Continue on same dosage 1.5 tablets daily except 1 tablet on Mondays, Wednesdays and Fridays.  Recheck INR in 2 weeks. [phone]

## 2019-12-08 ENCOUNTER — Ambulatory Visit: Payer: Medicare Other

## 2019-12-08 ENCOUNTER — Other Ambulatory Visit: Payer: Self-pay

## 2019-12-08 DIAGNOSIS — Z952 Presence of prosthetic heart valve: Secondary | ICD-10-CM

## 2019-12-08 DIAGNOSIS — Z5181 Encounter for therapeutic drug level monitoring: Secondary | ICD-10-CM | POA: Diagnosis not present

## 2019-12-08 DIAGNOSIS — I4891 Unspecified atrial fibrillation: Secondary | ICD-10-CM | POA: Diagnosis not present

## 2019-12-08 LAB — POCT INR: INR: 3.9 — AB (ref 2.0–3.0)

## 2019-12-08 NOTE — Patient Instructions (Signed)
Description   Take 1/2 tablet today, then start taking 1 tablet daily except 1.5 tablets on Tuesdays, Thursdays and Saturdays.  Recheck INR in 3 weeks. [phone]

## 2019-12-29 ENCOUNTER — Other Ambulatory Visit: Payer: Self-pay

## 2019-12-29 ENCOUNTER — Ambulatory Visit (INDEPENDENT_AMBULATORY_CARE_PROVIDER_SITE_OTHER): Payer: Medicare Other | Admitting: *Deleted

## 2019-12-29 DIAGNOSIS — Z5181 Encounter for therapeutic drug level monitoring: Secondary | ICD-10-CM

## 2019-12-29 DIAGNOSIS — I4891 Unspecified atrial fibrillation: Secondary | ICD-10-CM | POA: Diagnosis not present

## 2019-12-29 DIAGNOSIS — Z952 Presence of prosthetic heart valve: Secondary | ICD-10-CM

## 2019-12-29 LAB — POCT INR: INR: 4.3 — AB (ref 2.0–3.0)

## 2019-12-29 NOTE — Patient Instructions (Signed)
Description   Hold today's dose of Warfarin, then start taking 1 tablet daily except 1.5 tablets on Tuesdays and Saturdays. Recheck INR in 3 weeks. [phone]

## 2020-01-19 ENCOUNTER — Other Ambulatory Visit: Payer: Self-pay

## 2020-01-19 ENCOUNTER — Ambulatory Visit: Payer: Medicare Other | Admitting: *Deleted

## 2020-01-19 DIAGNOSIS — I4891 Unspecified atrial fibrillation: Secondary | ICD-10-CM | POA: Diagnosis not present

## 2020-01-19 DIAGNOSIS — Z952 Presence of prosthetic heart valve: Secondary | ICD-10-CM

## 2020-01-19 DIAGNOSIS — Z5181 Encounter for therapeutic drug level monitoring: Secondary | ICD-10-CM

## 2020-01-19 LAB — POCT INR: INR: 3.1 — AB (ref 2.0–3.0)

## 2020-01-19 NOTE — Patient Instructions (Signed)
Description   Continue taking 1 tablet daily except 1.5 tablets on Tuesdays and Saturdays. Recheck INR in 4 weeks. [phone]     

## 2020-02-16 ENCOUNTER — Ambulatory Visit: Payer: Medicare Other | Admitting: *Deleted

## 2020-02-16 ENCOUNTER — Other Ambulatory Visit: Payer: Self-pay

## 2020-02-16 DIAGNOSIS — Z952 Presence of prosthetic heart valve: Secondary | ICD-10-CM

## 2020-02-16 DIAGNOSIS — Z5181 Encounter for therapeutic drug level monitoring: Secondary | ICD-10-CM | POA: Diagnosis not present

## 2020-02-16 DIAGNOSIS — I4891 Unspecified atrial fibrillation: Secondary | ICD-10-CM

## 2020-02-16 LAB — POCT INR: INR: 2.5 (ref 2.0–3.0)

## 2020-02-16 NOTE — Patient Instructions (Signed)
Description   Continue taking 1 tablet daily except 1.5 tablets on Tuesdays and Saturdays. Recheck INR in 4 weeks. [phone]

## 2020-03-04 ENCOUNTER — Other Ambulatory Visit: Payer: Self-pay

## 2020-03-04 ENCOUNTER — Ambulatory Visit (INDEPENDENT_AMBULATORY_CARE_PROVIDER_SITE_OTHER): Payer: Medicare Other | Admitting: Emergency Medicine

## 2020-03-04 DIAGNOSIS — I442 Atrioventricular block, complete: Secondary | ICD-10-CM

## 2020-03-04 NOTE — Patient Instructions (Signed)
You will be scheduled later this year  for 1 year follow-up with Dr Ladona Ridgel in January, 2022.

## 2020-03-11 NOTE — Progress Notes (Signed)
Device check in clinic by industry by [employer]. Follow up with Dr Ladona Ridgel in 6 months. See scanned in report for details.

## 2020-03-15 ENCOUNTER — Ambulatory Visit: Payer: Medicare Other | Admitting: *Deleted

## 2020-03-15 ENCOUNTER — Other Ambulatory Visit: Payer: Self-pay

## 2020-03-15 DIAGNOSIS — Z5181 Encounter for therapeutic drug level monitoring: Secondary | ICD-10-CM | POA: Diagnosis not present

## 2020-03-15 DIAGNOSIS — Z952 Presence of prosthetic heart valve: Secondary | ICD-10-CM | POA: Diagnosis not present

## 2020-03-15 DIAGNOSIS — I4891 Unspecified atrial fibrillation: Secondary | ICD-10-CM

## 2020-03-15 LAB — POCT INR: INR: 3.6 — AB (ref 2.0–3.0)

## 2020-03-15 NOTE — Patient Instructions (Signed)
Description    Eat an extra serving of greens today and then Continue taking  warfarin 1 tablet daily except 1.5 tablets on Tuesdays and Saturdays. Recheck INR in 4 weeks. [phone]

## 2020-04-12 ENCOUNTER — Other Ambulatory Visit: Payer: Self-pay

## 2020-04-12 ENCOUNTER — Ambulatory Visit: Payer: Medicare Other | Admitting: Pharmacist

## 2020-04-12 DIAGNOSIS — Z952 Presence of prosthetic heart valve: Secondary | ICD-10-CM | POA: Diagnosis not present

## 2020-04-12 DIAGNOSIS — I4891 Unspecified atrial fibrillation: Secondary | ICD-10-CM

## 2020-04-12 DIAGNOSIS — Z5181 Encounter for therapeutic drug level monitoring: Secondary | ICD-10-CM | POA: Diagnosis not present

## 2020-04-12 LAB — POCT INR: INR: 5 — AB (ref 2.0–3.0)

## 2020-04-12 MED ORDER — WARFARIN SODIUM 7.5 MG PO TABS: ORAL_TABLET | ORAL | 1 refills | Status: DC

## 2020-04-12 NOTE — Patient Instructions (Addendum)
Hold warfarin tomorrow and Wed and then start taking warfarin 1 tablet daily except 1.5 tablets on Tuesdays. Recheck INR in 2 weeks. [phone]. Keep your greens intake consistent.

## 2020-04-26 ENCOUNTER — Other Ambulatory Visit: Payer: Self-pay

## 2020-04-26 ENCOUNTER — Ambulatory Visit: Payer: Medicare Other | Admitting: *Deleted

## 2020-04-26 DIAGNOSIS — I4891 Unspecified atrial fibrillation: Secondary | ICD-10-CM

## 2020-04-26 DIAGNOSIS — Z5181 Encounter for therapeutic drug level monitoring: Secondary | ICD-10-CM

## 2020-04-26 DIAGNOSIS — Z952 Presence of prosthetic heart valve: Secondary | ICD-10-CM

## 2020-04-26 LAB — POCT INR: INR: 2.8 (ref 2.0–3.0)

## 2020-04-26 NOTE — Patient Instructions (Signed)
Description   Continue taking warfarin 1 tablet daily except 1.5 tablets on Tuesdays and Saturdays. Recheck INR in 3 weeks. [phone]. Keep your greens intake consistent.

## 2020-05-17 ENCOUNTER — Ambulatory Visit: Payer: Medicare Other | Admitting: *Deleted

## 2020-05-17 ENCOUNTER — Other Ambulatory Visit: Payer: Self-pay

## 2020-05-17 DIAGNOSIS — Z5181 Encounter for therapeutic drug level monitoring: Secondary | ICD-10-CM

## 2020-05-17 DIAGNOSIS — Z952 Presence of prosthetic heart valve: Secondary | ICD-10-CM | POA: Diagnosis not present

## 2020-05-17 DIAGNOSIS — I4891 Unspecified atrial fibrillation: Secondary | ICD-10-CM

## 2020-05-17 LAB — POCT INR: INR: 3.7 — AB (ref 2.0–3.0)

## 2020-05-17 NOTE — Patient Instructions (Addendum)
Description    Take 1/2 a tablet of warfarin today and then start taking 1 tablet daily except for 1.5 tablets on Saturday. Recheck INR in 3 weeks. [phone]. Keep your greens intake consistent.

## 2020-06-07 ENCOUNTER — Other Ambulatory Visit: Payer: Self-pay

## 2020-06-07 ENCOUNTER — Ambulatory Visit: Payer: Medicare Other | Admitting: Pharmacist

## 2020-06-07 DIAGNOSIS — I639 Cerebral infarction, unspecified: Secondary | ICD-10-CM | POA: Diagnosis not present

## 2020-06-07 DIAGNOSIS — Z5181 Encounter for therapeutic drug level monitoring: Secondary | ICD-10-CM | POA: Diagnosis not present

## 2020-06-07 DIAGNOSIS — Z952 Presence of prosthetic heart valve: Secondary | ICD-10-CM | POA: Diagnosis not present

## 2020-06-07 DIAGNOSIS — I4891 Unspecified atrial fibrillation: Secondary | ICD-10-CM

## 2020-06-07 LAB — POCT INR: INR: 3.1 — AB (ref 2.0–3.0)

## 2020-06-07 NOTE — Patient Instructions (Signed)
Description   Continue 1 tablet daily except for 1.5 tablets on Saturdays.  Recheck INR in 4 weeks. [phone]. Keep your greens intake consistent.

## 2020-07-12 ENCOUNTER — Other Ambulatory Visit: Payer: Self-pay

## 2020-07-12 ENCOUNTER — Ambulatory Visit (INDEPENDENT_AMBULATORY_CARE_PROVIDER_SITE_OTHER): Payer: Medicare Other | Admitting: *Deleted

## 2020-07-12 DIAGNOSIS — I4891 Unspecified atrial fibrillation: Secondary | ICD-10-CM | POA: Diagnosis not present

## 2020-07-12 DIAGNOSIS — Z5181 Encounter for therapeutic drug level monitoring: Secondary | ICD-10-CM

## 2020-07-12 DIAGNOSIS — Z952 Presence of prosthetic heart valve: Secondary | ICD-10-CM | POA: Diagnosis not present

## 2020-07-12 LAB — POCT INR: INR: 2.1 (ref 2.0–3.0)

## 2020-07-12 MED ORDER — WARFARIN SODIUM 7.5 MG PO TABS: ORAL_TABLET | ORAL | 2 refills | Status: DC

## 2020-07-12 NOTE — Patient Instructions (Signed)
Description   Take 1.5 tablets today, then continue 1 tablet daily except for 1.5 tablets on Saturdays.  Recheck INR in 3 weeks. [phone]. Keep your greens intake consistent.

## 2020-08-02 ENCOUNTER — Other Ambulatory Visit: Payer: Self-pay

## 2020-08-02 ENCOUNTER — Ambulatory Visit: Payer: Medicare Other

## 2020-08-02 DIAGNOSIS — Z952 Presence of prosthetic heart valve: Secondary | ICD-10-CM

## 2020-08-02 DIAGNOSIS — I4891 Unspecified atrial fibrillation: Secondary | ICD-10-CM

## 2020-08-02 DIAGNOSIS — Z5181 Encounter for therapeutic drug level monitoring: Secondary | ICD-10-CM | POA: Diagnosis not present

## 2020-08-02 LAB — POCT INR: INR: 3 (ref 2.0–3.0)

## 2020-08-02 NOTE — Patient Instructions (Signed)
-   continue 1 tablet daily except for 1.5 tablets on Saturdays.   - Recheck INR in 4 weeks.  [phone]. Keep your greens intake consistent.

## 2020-08-07 IMAGING — CT CT ANGIO HEAD
1 of 13 series · 3 of 33 positions shown · IV contrast (APPLIED)
Comparison: Plain head CT 9272 hours today.

CLINICAL DATA: 73-year-old female code stroke presentation with
slurred speech and left side deficits.

EXAM:
CT ANGIOGRAPHY HEAD AND NECK
TECHNIQUE: Multidetector CT imaging of the head and neck was performed using
the standard protocol during bolus administration of intravenous
contrast. Multiplanar CT image reconstructions and MIPs were
obtained to evaluate the vascular anatomy. Carotid stenosis
measurements (when applicable) are obtained utilizing NASCET
criteria, using the distal internal carotid diameter as the
denominator.
CONTRAST:  75mL OMNIPAQUE IOHEXOL 300 MG/ML  SOLN

[Series 9: cta neck/head (person_name) · axial · 0.45mm/px · z∈[+955,+1129]mm · 3 of 686 slices shown]
[im 172/686  soft-tissue]
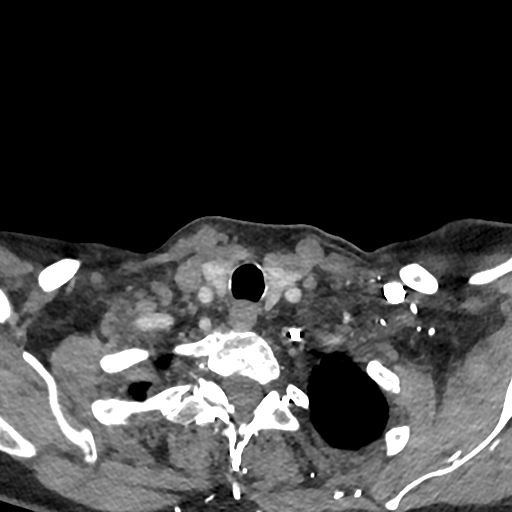
[im 343/686  bone]
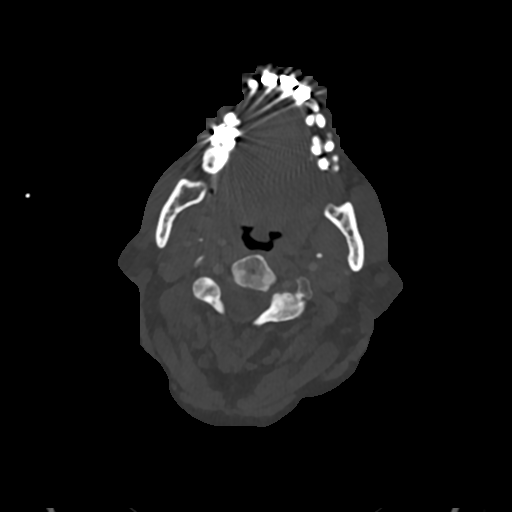
[im 514/686  soft-tissue]
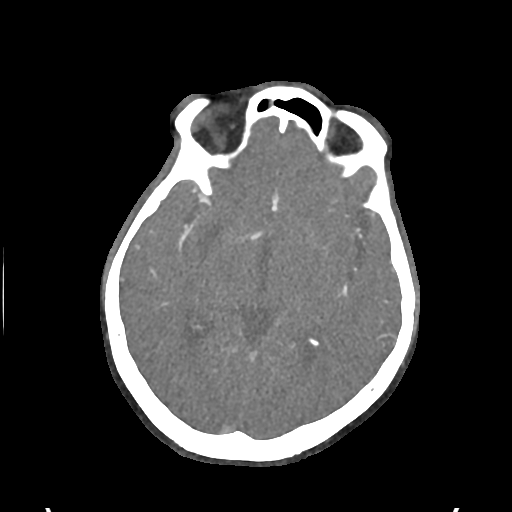

[3 of 33 positions shown; findings below may reference images not displayed]

FINDINGS: CTA NECK

Skeleton: Prior sternotomy. Mild for age spine degeneration.

Upper chest: Left side cardiac pacemaker type device.

Pulmonary artery dominant contrast timing. Motion artifact in the
chest and lungs with streak artifact, but suspicious low-density
filling defect and right upper lobe pulmonary artery branches
(series 8, image 160) suggesting acute pulmonary embolus. Some
similar appearance of the left upper lobe branches. No upper lung
opacity. No superior mediastinal lymphadenopathy.

Other neck: No acute findings.

Aortic arch: Suboptimal aortic arch contrast timing. Calcified
aortic atherosclerosis. 3 vessel arch configuration.

Right carotid system: Improved arterial contrast timing toward the
upper neck. Minimal calcified plaque at the medial right ICA origin,
no stenosis.

Left carotid system: Minimal calcified plaque left ICA origin.
Tortuosity without stenosis.

Vertebral arteries:
Calcified plaque at the right subclavian artery origin and motion
artifact. No definite stenosis. Normal right vertebral artery
origin. Patent and normal right vertebral to the skull base.

Proximal origin of the left vertebral artery with mild calcified
plaque but no significant stenosis suspected (series 11, image 149).
The left vertebral is non dominant but appears patent to the skull
base without significant stenosis.

CTA HEAD

Posterior circulation: Dominant right vertebral artery V4 segment,
the left V4 is diminutive beyond PICA. No distal vertebral stenosis.
Both PICA origins appear patent. Patent vertebrobasilar junction and
basilar artery without stenosis. Patent SCA and PCA origins.
Posterior communicating arteries are diminutive or absent. Bilateral
PCA branches are within normal limits.

Anterior circulation: Both ICA siphons are patent. Mild calcified
plaque without stenosis on the left. Similar mild calcified plaque
without stenosis on the right. Patent carotid termini. Normal MCA
and ACA origins. Anterior communicating artery and bilateral ACA
branches are within normal limits. Left MCA M1 segment and
bifurcation are patent without stenosis. Left MCA branches are
within normal limits. Right MCA M1 segment and bifurcation are
patent without stenosis. There is a paucity of right MCA middle
sylvian branches which is likely chronic, associated with
encephalomalacia. Otherwise right MCA branches are within normal
limits.

Venous sinuses: Early contrast timing, not well evaluated.

Anatomic variants: Dominant right vertebral artery.

Review of the MIP images confirms the above findings
IMPRESSION: 1. Positive for evidence of bilateral upper lobe Acute Pulmonary
Emboli.
2. Negative for large vessel occlusion.
3. Mild for age atherosclerosis and no hemodynamically significant
arterial stenosis in the head or neck.
4. Asymmetric decreased number of Right MCA middle sylvian branches
associated with chronic infarct and encephalomalacia.
5. Aortic Atherosclerosis (HVXU8-2V5.5).

Preliminary results of the above These results were communicated to
Dr. Deanne at 2454 hours by text page via the [employer] messaging system.

## 2020-08-07 IMAGING — CT CT ANGIO NECK
2 of 7 series · 8 of 33 positions shown · IV contrast (APPLIED)
Comparison: Plain head CT 9272 hours today.

CLINICAL DATA: 73-year-old female code stroke presentation with
slurred speech and left side deficits.

EXAM:
CT ANGIOGRAPHY HEAD AND NECK
TECHNIQUE: Multidetector CT imaging of the head and neck was performed using
the standard protocol during bolus administration of intravenous
contrast. Multiplanar CT image reconstructions and MIPs were
obtained to evaluate the vascular anatomy. Carotid stenosis
measurements (when applicable) are obtained utilizing NASCET
criteria, using the distal internal carotid diameter as the
denominator.
CONTRAST:  75mL OMNIPAQUE IOHEXOL 300 MG/ML  SOLN

[Series 8: cta neck/head (person_name) · axial · 0.45mm/px · z∈[+984,+1098]mm · 2 of 173 slices shown]
[im 58/173  soft-tissue]
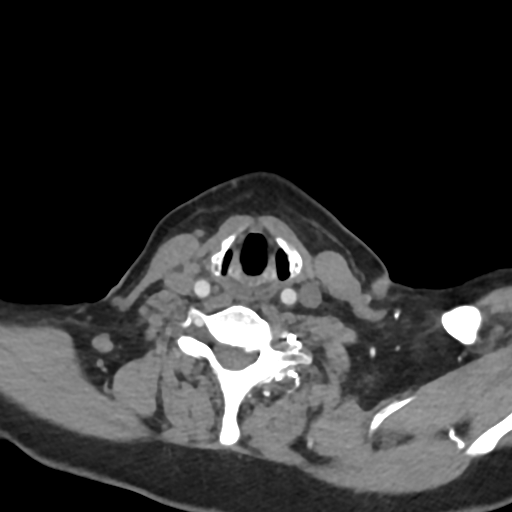
[im 115/173  soft-tissue]
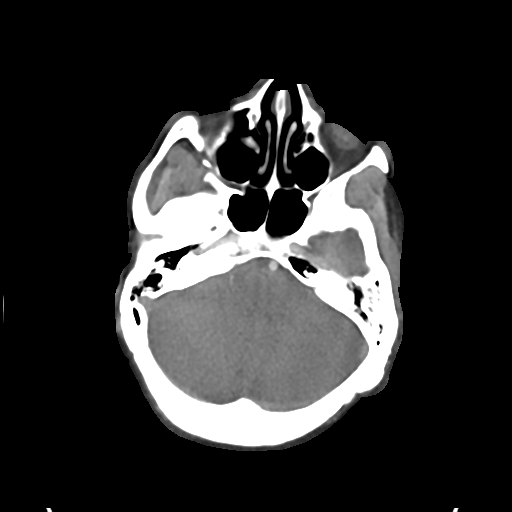

[Series 10: ax thins (person_name) · axial · 0.39mm/px · z∈[+919,+1166]mm · 6 of 347 slices shown]
[im 50/347  soft-tissue]
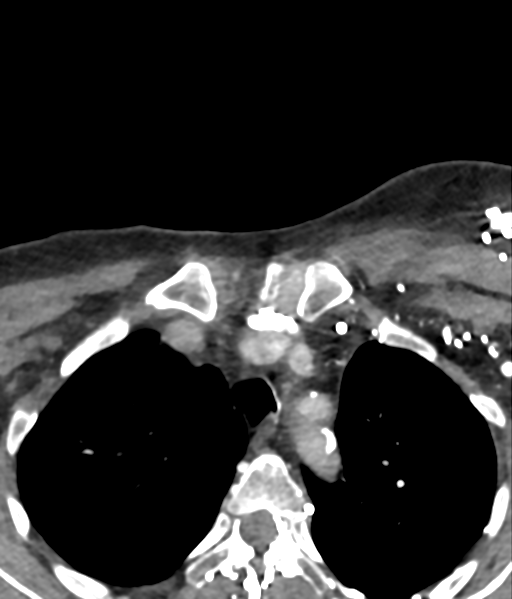
[im 99/347  bone]
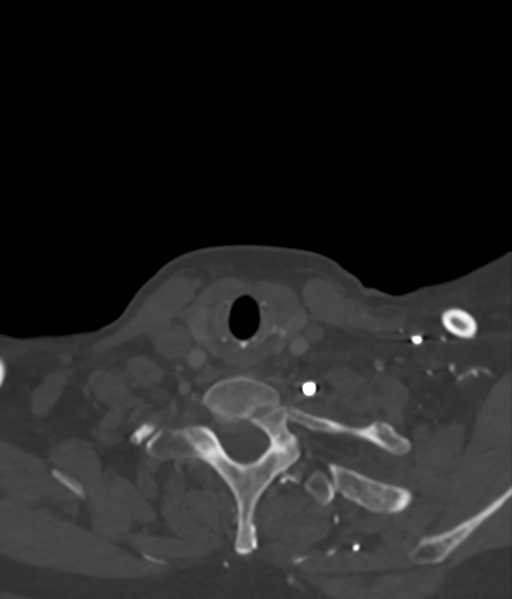
[im 149/347  soft-tissue]
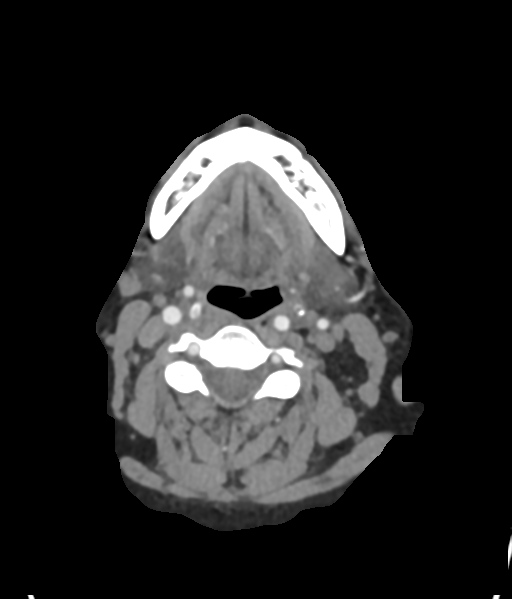
[im 198/347  bone]
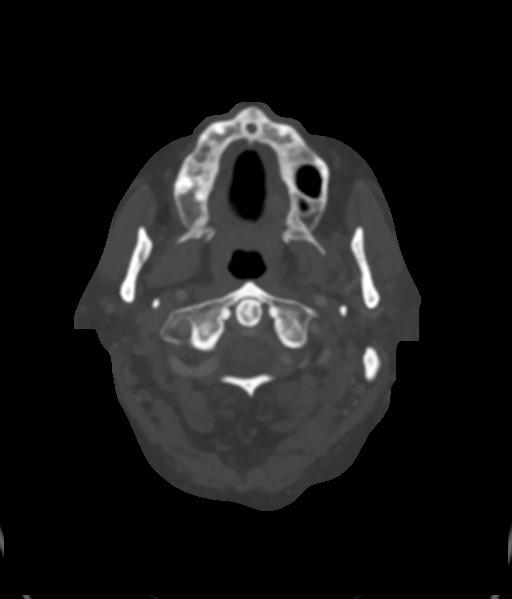
[im 248/347  soft-tissue]
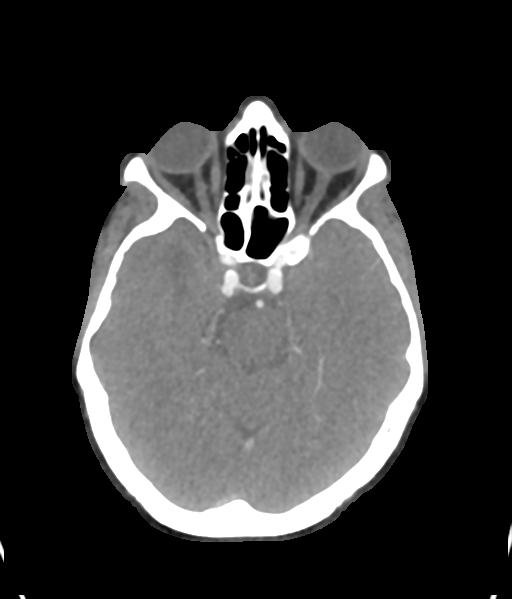
[im 297/347  bone]
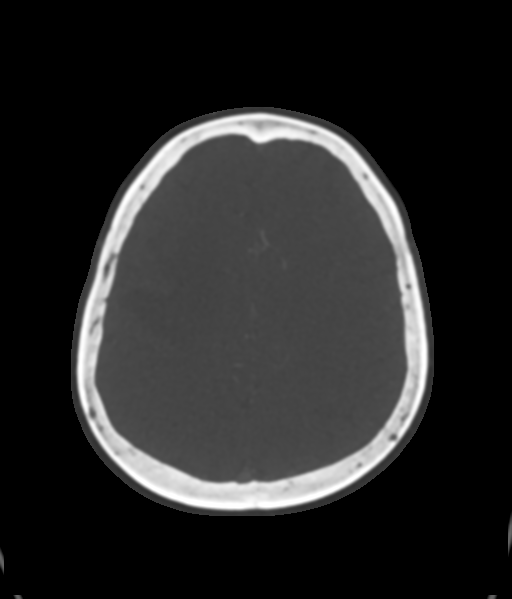

[8 of 33 positions shown; findings below may reference images not displayed]

FINDINGS: CTA NECK

Skeleton: Prior sternotomy. Mild for age spine degeneration.

Upper chest: Left side cardiac pacemaker type device.

Pulmonary artery dominant contrast timing. Motion artifact in the
chest and lungs with streak artifact, but suspicious low-density
filling defect and right upper lobe pulmonary artery branches
(series 8, image 160) suggesting acute pulmonary embolus. Some
similar appearance of the left upper lobe branches. No upper lung
opacity. No superior mediastinal lymphadenopathy.

Other neck: No acute findings.

Aortic arch: Suboptimal aortic arch contrast timing. Calcified
aortic atherosclerosis. 3 vessel arch configuration.

Right carotid system: Improved arterial contrast timing toward the
upper neck. Minimal calcified plaque at the medial right ICA origin,
no stenosis.

Left carotid system: Minimal calcified plaque left ICA origin.
Tortuosity without stenosis.

Vertebral arteries:
Calcified plaque at the right subclavian artery origin and motion
artifact. No definite stenosis. Normal right vertebral artery
origin. Patent and normal right vertebral to the skull base.

Proximal origin of the left vertebral artery with mild calcified
plaque but no significant stenosis suspected (series 11, image 149).
The left vertebral is non dominant but appears patent to the skull
base without significant stenosis.

CTA HEAD

Posterior circulation: Dominant right vertebral artery V4 segment,
the left V4 is diminutive beyond PICA. No distal vertebral stenosis.
Both PICA origins appear patent. Patent vertebrobasilar junction and
basilar artery without stenosis. Patent SCA and PCA origins.
Posterior communicating arteries are diminutive or absent. Bilateral
PCA branches are within normal limits.

Anterior circulation: Both ICA siphons are patent. Mild calcified
plaque without stenosis on the left. Similar mild calcified plaque
without stenosis on the right. Patent carotid termini. Normal MCA
and ACA origins. Anterior communicating artery and bilateral ACA
branches are within normal limits. Left MCA M1 segment and
bifurcation are patent without stenosis. Left MCA branches are
within normal limits. Right MCA M1 segment and bifurcation are
patent without stenosis. There is a paucity of right MCA middle
sylvian branches which is likely chronic, associated with
encephalomalacia. Otherwise right MCA branches are within normal
limits.

Venous sinuses: Early contrast timing, not well evaluated.

Anatomic variants: Dominant right vertebral artery.

Review of the MIP images confirms the above findings
IMPRESSION: 1. Positive for evidence of bilateral upper lobe Acute Pulmonary
Emboli.
2. Negative for large vessel occlusion.
3. Mild for age atherosclerosis and no hemodynamically significant
arterial stenosis in the head or neck.
4. Asymmetric decreased number of Right MCA middle sylvian branches
associated with chronic infarct and encephalomalacia.
5. Aortic Atherosclerosis (HVXU8-2V5.5).

Preliminary results of the above These results were communicated to
Dr. Deanne at 2454 hours by text page via the [employer] messaging system.

## 2020-08-12 ENCOUNTER — Telehealth: Payer: Self-pay | Admitting: Emergency Medicine

## 2020-08-12 NOTE — Telephone Encounter (Signed)
Pamela Walker from [employer] aware of patient's appointment with Dr Ladona Ridgel and he will be available 08/16/20 to check [employer] device at 0945 at [facility].

## 2020-08-16 ENCOUNTER — Other Ambulatory Visit: Payer: Self-pay

## 2020-08-16 ENCOUNTER — Encounter: Payer: Self-pay | Admitting: Internal Medicine

## 2020-08-16 ENCOUNTER — Ambulatory Visit: Payer: Medicare Other | Admitting: Internal Medicine

## 2020-08-16 VITALS — BP 130/80 | HR 70 | Ht 63.0 in | Wt 133.0 lb

## 2020-08-16 DIAGNOSIS — I4891 Unspecified atrial fibrillation: Secondary | ICD-10-CM | POA: Diagnosis not present

## 2020-08-16 DIAGNOSIS — I442 Atrioventricular block, complete: Secondary | ICD-10-CM | POA: Diagnosis not present

## 2020-08-16 DIAGNOSIS — Z950 Presence of cardiac pacemaker: Secondary | ICD-10-CM | POA: Diagnosis not present

## 2020-08-16 DIAGNOSIS — I739 Peripheral vascular disease, unspecified: Secondary | ICD-10-CM | POA: Diagnosis not present

## 2020-08-16 MED ORDER — CARVEDILOL 6.25 MG PO TABS: 6.2500 mg | ORAL_TABLET | Freq: Two times a day (BID) | ORAL | 3 refills | Status: AC

## 2020-08-16 NOTE — Patient Instructions (Addendum)
Medication Instructions:  Your physician recommends that you continue on your current medications as directed. Please refer to the Current Medication list given to you today.  Labwork: None ordered.  Testing/Procedures: None ordered.  Follow-Up:  Your physician wants you to follow-up in: 6 months with device clinic for a pacemaker check.   February 01, 2021 at 8:40 am   Your physician wants you to follow-up in: one year with [name], MD or one of the following Advanced Practice Providers on your designated Care Team:    Gypsy Balsam, NP  Francis Dowse, PA-C  Casimiro Needle "Mardelle Matte" Lanna Poche, [place]  Any Other Special Instructions Will Be Listed Below (If Applicable).  If you need a refill on your cardiac medications before your next appointment, please call your pharmacy.

## 2020-08-16 NOTE — Progress Notes (Signed)
HPI [name] returns today for followup. She is a pleasant 75 yo woman with MV replacement, CHB, s/p PPM insertion. She has done well in the interim. She denies chest pain or sob. She remains on chronic coumadin therapy. She is just over a year out from ERI. Her [employer] PM is working normally. She has chronic atrial fib. She notes that she has had some pain in her legs when she walks which resolves with rest.  No Known Allergies   Current Outpatient Medications  Medication Sig Dispense Refill   amoxicillin (AMOXIL) 500 MG tablet Take 4 tablets (2 g) 30-60 minutes prior to dental appointment/prodedure 4 tablet 3   aspirin EC 81 MG tablet Take 1 tablet (81 mg total) by mouth daily.     atorvastatin (LIPITOR) 40 MG tablet Take 1 tablet by mouth daily.     carvedilol (COREG) 6.25 MG tablet Take 1 tablet (6.25 mg total) by mouth 2 (two) times daily. 180 tablet 3   levETIRAcetam (KEPPRA) 500 MG tablet Take 1 tablet (500 mg total) by mouth 2 (two) times daily. 60 tablet 0   lisinopril (PRINIVIL,ZESTRIL) 5 MG tablet Take 1 tablet (5 mg total) by mouth daily. 90 tablet 2   metFORMIN (GLUCOPHAGE-XR) 500 MG 24 hr tablet Take 500 mg by mouth daily.     spironolactone (ALDACTONE) 25 MG tablet Take 25 mg by mouth as directed. MWF     traMADol (ULTRAM) 50 MG tablet Take 1 tablet (50 mg total) by mouth every 6 (six) hours as needed for moderate pain. 15 tablet 0   warfarin (COUMADIN) 7.5 MG tablet Take 1 tablet to 1 and 1/2 tablets by mouth daily as directed by the coumadin clinic 45 tablet 2   zolpidem (AMBIEN) 10 MG tablet Take 10 mg by mouth at bedtime.     No current facility-administered medications for this visit.     Past Medical History:  Diagnosis Date   Atrial fibrillation (HCC)    on coumadin   DM (diabetes mellitus) (HCC)    Hyperlipidemia    Rheumatic fever/heart disease    Seizure (HCC)    Stroke (HCC) 2017   ICH    ROS:   All systems reviewed and  negative except as noted in the HPI.   Past Surgical History:  Procedure Laterality Date   APPENDECTOMY     CESAREAN SECTION     INSERT / REPLACE / REMOVE PACEMAKER     MITRAL VALVE REPLACEMENT       Family History  Problem Relation Age of Onset   Heart disease Mother        angina   Arrhythmia Sister        Pacer     Social History   Socioeconomic History   Marital status: Married    Spouse name: Not on file   Number of children: Not on file   Years of education: Not on file   Highest education level: Not on file  Occupational History   Not on file  Tobacco Use   Smoking status: Former Smoker    Packs/day: 2.00    Years: 10.00    Pack years: 20.00    Types: Cigarettes    Quit date: 1980    Years since quitting: 42.1   Smokeless tobacco: Never Used  Substance and Sexual Activity   Alcohol use: No    Alcohol/week: 0.0 standard drinks    Comment: 1 glass of wine every few months.  Drug use: No   Sexual activity: Not on file  Other Topics Concern   Not on file  Social History Narrative   Not on file   Social Determinants of Health   Financial Resource Strain: Not on file  Food Insecurity: Not on file  Transportation Needs: Not on file  Physical Activity: Not on file  Stress: Not on file  Social Connections: Not on file  Intimate Partner Violence: Not on file     Ht 5\' 3"  (1.6 m)    BMI 24.49 kg/m   Physical Exam:  Well appearing 75 yo woman, NAD HEENT: Unremarkable Neck:  No JVD, no thyromegally Lymphatics:  No adenopathy Back:  No CVA tenderness Lungs:  Clear with no wheezes HEART:  Regular rate rhythm, no murmurs, no rubs, no clicks; mechanical S1.  Abd:  soft, positive bowel sounds, no organomegally, no rebound, no guarding Ext:  2 plus pulses, no edema, no cyanosis, no clubbing Skin:  No rashes no nodules Neuro:  CN II through XII intact, motor grossly intact  EKG - atrial fib with ventricular pacing  DEVICE   Normal device function.  See PaceArt for details. 10 months to ERI  Assess/Plan: 1. CHB - she is asymptomatic, s/p PPM. No escape at 30.  2. Atrial fib - she is chronically in atrial fib. Her rates are well controlled. 3. MV replacement - her valve on exam appears to be working normally.  4. coags - she has had no bleeding on coumadin. She gets her INR checked in our coumadin clinic. 5. Claudication - she will undergo duplex dopplers.   [address] Gyan Cambre,MD

## 2020-08-24 LAB — CUP PACEART INCLINIC DEVICE CHECK
Date Time Interrogation Session: 20220228120803
Implantable Lead Implant Date: 20120120
Implantable Lead Implant Date: 20120120
Implantable Lead Location: 753859
Implantable Lead Location: 753860
Implantable Pulse Generator Implant Date: 20120120

## 2020-08-27 ENCOUNTER — Other Ambulatory Visit: Payer: Self-pay

## 2020-08-27 ENCOUNTER — Ambulatory Visit (HOSPITAL_COMMUNITY)
Admission: RE | Admit: 2020-08-27 | Discharge: 2020-08-27 | Disposition: A | Discharge status: Home / Self Care | Payer: Medicare Other | Source: Ambulatory Visit | Attending: Cardiology | Admitting: Cardiology

## 2020-08-27 DIAGNOSIS — I739 Peripheral vascular disease, unspecified: Secondary | ICD-10-CM | POA: Insufficient documentation

## 2020-08-30 ENCOUNTER — Other Ambulatory Visit: Payer: Self-pay

## 2020-08-30 ENCOUNTER — Ambulatory Visit (INDEPENDENT_AMBULATORY_CARE_PROVIDER_SITE_OTHER): Payer: Medicare Other

## 2020-08-30 DIAGNOSIS — Z952 Presence of prosthetic heart valve: Secondary | ICD-10-CM

## 2020-08-30 DIAGNOSIS — Z5181 Encounter for therapeutic drug level monitoring: Secondary | ICD-10-CM | POA: Diagnosis not present

## 2020-08-30 DIAGNOSIS — I4891 Unspecified atrial fibrillation: Secondary | ICD-10-CM | POA: Diagnosis not present

## 2020-08-30 LAB — POCT INR: INR: 3.1 — AB (ref 2.0–3.0)

## 2020-08-30 NOTE — Patient Instructions (Signed)
-   continue 1 tablet daily except for 1.5 tablets on Saturdays.   - Recheck INR in 6 weeks.  [phone]. Keep your greens intake consistent

## 2020-09-16 ENCOUNTER — Telehealth: Payer: Self-pay

## 2020-09-16 NOTE — Telephone Encounter (Signed)
-----   Message from Marinus Maw, MD sent at 08/29/2020  7:59 PM EDT ----- The ultrasound of the blood vessels in the legs shows that the blood flow to her legs is normal and the pain in her legs is not caused by a blocked artery in the legs. Most likely the problem is neurologic. If pain continues, she should followup with her primary MD to consider additional medical therapy.

## 2020-09-16 NOTE — Telephone Encounter (Signed)
Call placed to Pt using interpreter.  Left detailed message for Pt (through interpreter) advising ultrasound of her legs did NOT show any blockages.  Advised to call office if any further questions.

## 2020-10-11 ENCOUNTER — Other Ambulatory Visit: Payer: Self-pay

## 2020-10-11 ENCOUNTER — Ambulatory Visit (INDEPENDENT_AMBULATORY_CARE_PROVIDER_SITE_OTHER): Payer: Medicare Other | Admitting: *Deleted

## 2020-10-11 DIAGNOSIS — I4891 Unspecified atrial fibrillation: Secondary | ICD-10-CM

## 2020-10-11 DIAGNOSIS — Z5181 Encounter for therapeutic drug level monitoring: Secondary | ICD-10-CM | POA: Diagnosis not present

## 2020-10-11 DIAGNOSIS — Z952 Presence of prosthetic heart valve: Secondary | ICD-10-CM

## 2020-10-11 LAB — POCT INR: INR: 3.9 — AB (ref 2.0–3.0)

## 2020-10-11 MED ORDER — WARFARIN SODIUM 7.5 MG PO TABS: ORAL_TABLET | ORAL | 0 refills | Status: DC

## 2020-10-11 NOTE — Patient Instructions (Signed)
Description   - Hold warfarin today, then continue taking 1 tablet daily except for 1.5 tablets on Saturdays. Recheck INR in 6 weeks. [phone]. Keep your greens intake consistent.

## 2020-11-01 ENCOUNTER — Ambulatory Visit (INDEPENDENT_AMBULATORY_CARE_PROVIDER_SITE_OTHER): Payer: Medicare Other

## 2020-11-01 ENCOUNTER — Other Ambulatory Visit: Payer: Self-pay

## 2020-11-01 DIAGNOSIS — I4891 Unspecified atrial fibrillation: Secondary | ICD-10-CM | POA: Diagnosis not present

## 2020-11-01 DIAGNOSIS — Z5181 Encounter for therapeutic drug level monitoring: Secondary | ICD-10-CM

## 2020-11-01 DIAGNOSIS — Z952 Presence of prosthetic heart valve: Secondary | ICD-10-CM

## 2020-11-01 LAB — POCT INR: INR: 2.7 (ref 2.0–3.0)

## 2020-11-01 NOTE — Patient Instructions (Signed)
-   continue taking 1 tablet daily except for 1.5 tablets on Saturdays.  - Recheck INR in 6 weeks. [phone]. Keep your greens intake consistent.

## 2020-12-05 ENCOUNTER — Other Ambulatory Visit: Payer: Self-pay | Admitting: Cardiovascular Disease

## 2020-12-08 ENCOUNTER — Other Ambulatory Visit: Payer: Self-pay | Admitting: Cardiovascular Disease

## 2020-12-08 NOTE — Telephone Encounter (Signed)
Refill sent for 90 day supply with a refill on 12/06/20

## 2020-12-13 ENCOUNTER — Other Ambulatory Visit: Payer: Self-pay

## 2020-12-13 ENCOUNTER — Ambulatory Visit (INDEPENDENT_AMBULATORY_CARE_PROVIDER_SITE_OTHER): Payer: Medicare Other | Admitting: *Deleted

## 2020-12-13 DIAGNOSIS — Z952 Presence of prosthetic heart valve: Secondary | ICD-10-CM

## 2020-12-13 DIAGNOSIS — Z5181 Encounter for therapeutic drug level monitoring: Secondary | ICD-10-CM | POA: Diagnosis not present

## 2020-12-13 DIAGNOSIS — I4891 Unspecified atrial fibrillation: Secondary | ICD-10-CM

## 2020-12-13 LAB — POCT INR: INR: 2.8 (ref 2.0–3.0)

## 2020-12-13 NOTE — Patient Instructions (Signed)
Description   Continue taking 1 tablet daily except for 1.5 tablets on Saturdays. Recheck INR in 6 weeks. [phone]. Keep your greens intake consistent.

## 2021-02-02 ENCOUNTER — Other Ambulatory Visit: Payer: Self-pay

## 2021-02-02 ENCOUNTER — Ambulatory Visit (INDEPENDENT_AMBULATORY_CARE_PROVIDER_SITE_OTHER): Payer: Medicare Other

## 2021-02-02 ENCOUNTER — Ambulatory Visit: Payer: Medicare Other | Admitting: *Deleted

## 2021-02-02 DIAGNOSIS — Z952 Presence of prosthetic heart valve: Secondary | ICD-10-CM | POA: Diagnosis not present

## 2021-02-02 DIAGNOSIS — I428 Other cardiomyopathies: Secondary | ICD-10-CM | POA: Diagnosis not present

## 2021-02-02 DIAGNOSIS — Z5181 Encounter for therapeutic drug level monitoring: Secondary | ICD-10-CM

## 2021-02-02 DIAGNOSIS — I4891 Unspecified atrial fibrillation: Secondary | ICD-10-CM | POA: Diagnosis not present

## 2021-02-02 LAB — CUP PACEART INCLINIC DEVICE CHECK
Date Time Interrogation Session: 20220817102348
Implantable Lead Implant Date: 20120120
Implantable Lead Implant Date: 20120120
Implantable Lead Location: 753859
Implantable Lead Location: 753860
Implantable Pulse Generator Implant Date: 20120120

## 2021-02-02 LAB — POCT INR: INR: 3.2 — AB (ref 2.0–3.0)

## 2021-02-02 MED ORDER — WARFARIN SODIUM 7.5 MG PO TABS: ORAL_TABLET | ORAL | 1 refills | Status: DC

## 2021-02-02 NOTE — Progress Notes (Signed)
Device checked in-clinic by [employer] rep. See attachment for details.

## 2021-02-02 NOTE — Patient Instructions (Signed)
Description   Continue taking 1 tablet daily except for 1.5 tablets on Saturdays. Recheck INR in 6 weeks. [phone]. Keep your greens intake consistent.     

## 2021-03-14 ENCOUNTER — Ambulatory Visit: Payer: Medicare Other

## 2021-03-14 ENCOUNTER — Other Ambulatory Visit: Payer: Self-pay

## 2021-03-14 DIAGNOSIS — I4891 Unspecified atrial fibrillation: Secondary | ICD-10-CM

## 2021-03-14 DIAGNOSIS — Z5181 Encounter for therapeutic drug level monitoring: Secondary | ICD-10-CM | POA: Diagnosis not present

## 2021-03-14 DIAGNOSIS — Z952 Presence of prosthetic heart valve: Secondary | ICD-10-CM | POA: Diagnosis not present

## 2021-03-14 LAB — POCT INR: INR: 2.1 (ref 2.0–3.0)

## 2021-03-14 NOTE — Patient Instructions (Signed)
-   take extra 1/2 tablets tonight, then - Continue taking 1 tablet daily except for 1.5 tablets on Saturdays. - Recheck INR in 6 weeks.  [phone]. Keep your greens intake consistent.

## 2021-04-05 ENCOUNTER — Other Ambulatory Visit: Payer: Self-pay | Admitting: Internal Medicine

## 2021-04-25 ENCOUNTER — Ambulatory Visit: Payer: Medicare Other

## 2021-04-25 ENCOUNTER — Other Ambulatory Visit: Payer: Self-pay

## 2021-04-25 DIAGNOSIS — Z5181 Encounter for therapeutic drug level monitoring: Secondary | ICD-10-CM | POA: Diagnosis not present

## 2021-04-25 DIAGNOSIS — Z952 Presence of prosthetic heart valve: Secondary | ICD-10-CM

## 2021-04-25 DIAGNOSIS — I4891 Unspecified atrial fibrillation: Secondary | ICD-10-CM

## 2021-04-25 LAB — POCT INR: INR: 4.3 — AB (ref 2.0–3.0)

## 2021-04-25 NOTE — Patient Instructions (Signed)
Description   - Hold today's dose and then - Continue taking 1 tablet daily except for 1.5 tablets on Saturdays. - Recheck INR in 4 weeks.  [phone]. Keep your greens intake consistent.

## 2021-05-23 ENCOUNTER — Other Ambulatory Visit: Payer: Self-pay

## 2021-05-23 ENCOUNTER — Ambulatory Visit: Payer: Medicare Other

## 2021-05-23 DIAGNOSIS — Z952 Presence of prosthetic heart valve: Secondary | ICD-10-CM

## 2021-05-23 DIAGNOSIS — Z5181 Encounter for therapeutic drug level monitoring: Secondary | ICD-10-CM

## 2021-05-23 DIAGNOSIS — I4891 Unspecified atrial fibrillation: Secondary | ICD-10-CM

## 2021-05-23 LAB — POCT INR: INR: 3.3 — AB (ref 2.0–3.0)

## 2021-05-23 NOTE — Patient Instructions (Signed)
-   Continue taking 1 tablet daily except for 1.5 tablets on Saturdays. - Recheck INR in 5 weeks  [phone]. Keep your greens intake consistent.

## 2021-05-27 ENCOUNTER — Telehealth: Payer: Self-pay

## 2021-05-27 NOTE — Telephone Encounter (Signed)
Patient is scheduled for appt on 06/01/21.  She has Administrator.  Patient did not show for previous appt in Aug 2022.  Attempted to call patient to remind of appt, no answer.  VM is in [place].  Requested assistance frm language line to leave VM in spanish reminding of appt on 12/14 t 10:40 and request that patient call us if she is not able to keep appt.    Contact made with [employer] rep, Mark requesting assistance with appt.

## 2021-06-01 ENCOUNTER — Other Ambulatory Visit: Payer: Self-pay

## 2021-06-01 ENCOUNTER — Ambulatory Visit (INDEPENDENT_AMBULATORY_CARE_PROVIDER_SITE_OTHER): Payer: Medicare Other

## 2021-06-01 DIAGNOSIS — I442 Atrioventricular block, complete: Secondary | ICD-10-CM | POA: Diagnosis not present

## 2021-06-01 LAB — CUP PACEART INCLINIC DEVICE CHECK
Date Time Interrogation Session: 20221214103714
Eval Rhythm: <30
Implantable Lead Implant Date: 20120120
Implantable Lead Implant Date: 20120120
Implantable Lead Location: 753859
Implantable Lead Location: 753860
Implantable Pulse Generator Implant Date: 20120120

## 2021-06-01 NOTE — Progress Notes (Signed)
Device check in clinic by [employer] Rep.  See attached report for details.  Device has 5-8 months until ERI and Patient is device dependent.  Patient can return in 2 months for battery check and understands it will be checked monthly after that.  Follow-up appt scheduled for 08/03/21.  ERI rate is 80 bpm.  Patient educated if she should noticed change in HR sooner than appt to contact our office.

## 2021-06-27 ENCOUNTER — Ambulatory Visit: Payer: Medicare Other | Admitting: *Deleted

## 2021-06-27 ENCOUNTER — Other Ambulatory Visit: Payer: Self-pay

## 2021-06-27 DIAGNOSIS — Z5181 Encounter for therapeutic drug level monitoring: Secondary | ICD-10-CM

## 2021-06-27 DIAGNOSIS — I4891 Unspecified atrial fibrillation: Secondary | ICD-10-CM | POA: Diagnosis not present

## 2021-06-27 DIAGNOSIS — Z952 Presence of prosthetic heart valve: Secondary | ICD-10-CM | POA: Diagnosis not present

## 2021-06-27 LAB — POCT INR: INR: 3.3 — AB (ref 2.0–3.0)

## 2021-06-27 MED ORDER — WARFARIN SODIUM 7.5 MG PO TABS: ORAL_TABLET | ORAL | 1 refills | Status: DC

## 2021-06-27 NOTE — Patient Instructions (Signed)
Description   Continue taking 1 tablet daily except for 1.5 tablets on Saturdays. Recheck INR in 5 weeks with device clinic appt. Coumadin Clinic [phone]. Keep your greens intake consistent.

## 2021-08-03 ENCOUNTER — Other Ambulatory Visit: Payer: Self-pay

## 2021-08-03 ENCOUNTER — Ambulatory Visit: Payer: Medicare Other | Admitting: *Deleted

## 2021-08-03 ENCOUNTER — Ambulatory Visit (INDEPENDENT_AMBULATORY_CARE_PROVIDER_SITE_OTHER): Payer: Medicare Other

## 2021-08-03 DIAGNOSIS — Z5181 Encounter for therapeutic drug level monitoring: Secondary | ICD-10-CM | POA: Diagnosis not present

## 2021-08-03 DIAGNOSIS — Z952 Presence of prosthetic heart valve: Secondary | ICD-10-CM

## 2021-08-03 DIAGNOSIS — I4891 Unspecified atrial fibrillation: Secondary | ICD-10-CM

## 2021-08-03 DIAGNOSIS — I442 Atrioventricular block, complete: Secondary | ICD-10-CM

## 2021-08-03 LAB — CUP PACEART INCLINIC DEVICE CHECK
Battery Impedance: 5192.79 Ohm
Brady Statistic RV Percent Paced: 99 %
Date Time Interrogation Session: 20230215102833
Implantable Lead Implant Date: 20120120
Implantable Lead Implant Date: 20120120
Implantable Lead Location: 753859
Implantable Lead Location: 753860
Implantable Pulse Generator Implant Date: 20120120
Lead Channel Impedance Value: 604.21 Ohm
Lead Channel Pacing Threshold Amplitude: 0.75 V
Lead Channel Pacing Threshold Pulse Width: 0.35 ms
Lead Channel Sensing Intrinsic Amplitude: 15 mV
Lead Channel Setting Pacing Amplitude: 2.5 V
Lead Channel Setting Pacing Pulse Width: 0.35 ms
Lead Channel Setting Sensing Sensitivity: 5 mV

## 2021-08-03 LAB — POCT INR: INR: 3.5 — AB (ref 2.0–3.0)

## 2021-08-03 NOTE — Progress Notes (Signed)
Device check in clinic with industry see scanned document in media for details.  Battery estimated at 3-5 months until ERI.  Patient scheduled for ROV with Dr. Ladona Ridgel on 4/10.

## 2021-08-03 NOTE — Patient Instructions (Addendum)
Description   Continue taking 1 tablet daily except for 1.5 tablets on Saturdays. Recheck INR in 6 weeks. Coumadin Clinic [phone]. Keep your greens intake consistent.     

## 2021-08-29 ENCOUNTER — Other Ambulatory Visit: Payer: Self-pay | Admitting: Internal Medicine

## 2021-08-29 DIAGNOSIS — I4891 Unspecified atrial fibrillation: Secondary | ICD-10-CM

## 2021-08-29 DIAGNOSIS — Z5181 Encounter for therapeutic drug level monitoring: Secondary | ICD-10-CM

## 2021-08-29 DIAGNOSIS — Z952 Presence of prosthetic heart valve: Secondary | ICD-10-CM

## 2021-08-29 NOTE — Telephone Encounter (Signed)
Prescription refill request received for warfarin ?Lov: 08/16/2020 ?Next INR check: 3/27 ?Warfarin tablet strength: 7.5mg   ? ?Pt scheduled to see the cardiologist on 4/10 ?

## 2021-09-12 ENCOUNTER — Other Ambulatory Visit: Payer: Self-pay

## 2021-09-12 ENCOUNTER — Ambulatory Visit: Payer: Medicare Other

## 2021-09-12 DIAGNOSIS — I4891 Unspecified atrial fibrillation: Secondary | ICD-10-CM

## 2021-09-12 DIAGNOSIS — Z952 Presence of prosthetic heart valve: Secondary | ICD-10-CM | POA: Diagnosis not present

## 2021-09-12 DIAGNOSIS — Z5181 Encounter for therapeutic drug level monitoring: Secondary | ICD-10-CM

## 2021-09-12 LAB — POCT INR: INR: 2.1 (ref 2.0–3.0)

## 2021-09-12 NOTE — Patient Instructions (Addendum)
Description   ?Take 1.5 tablets today and then continue taking 1 tablet daily except for 1.5 tablets on Saturdays. Recheck INR in 2 weeks.  ?Coumadin Clinic [phone].  ?Keep your greens intake consistent. ?  ?   ?

## 2021-09-26 ENCOUNTER — Encounter: Payer: Self-pay | Admitting: Internal Medicine

## 2021-09-26 ENCOUNTER — Telehealth: Payer: Self-pay

## 2021-09-26 ENCOUNTER — Ambulatory Visit (INDEPENDENT_AMBULATORY_CARE_PROVIDER_SITE_OTHER): Payer: Medicare Other

## 2021-09-26 ENCOUNTER — Ambulatory Visit: Payer: Medicare Other | Admitting: Internal Medicine

## 2021-09-26 VITALS — BP 138/88 | HR 84 | Ht 63.0 in | Wt 132.0 lb

## 2021-09-26 DIAGNOSIS — I442 Atrioventricular block, complete: Secondary | ICD-10-CM

## 2021-09-26 DIAGNOSIS — I4891 Unspecified atrial fibrillation: Secondary | ICD-10-CM | POA: Diagnosis not present

## 2021-09-26 DIAGNOSIS — Z952 Presence of prosthetic heart valve: Secondary | ICD-10-CM

## 2021-09-26 DIAGNOSIS — Z5181 Encounter for therapeutic drug level monitoring: Secondary | ICD-10-CM

## 2021-09-26 DIAGNOSIS — I4821 Permanent atrial fibrillation: Secondary | ICD-10-CM | POA: Diagnosis not present

## 2021-09-26 DIAGNOSIS — Z950 Presence of cardiac pacemaker: Secondary | ICD-10-CM

## 2021-09-26 LAB — CBC WITH DIFFERENTIAL/PLATELET
Basophils Absolute: 0 10*3/uL (ref 0.0–0.2)
Basos: 0 %
EOS (ABSOLUTE): 0.2 10*3/uL (ref 0.0–0.4)
Eos: 5 %
Hematocrit: 36.6 % (ref 34.0–46.6)
Hemoglobin: 12.2 g/dL (ref 11.1–15.9)
Immature Grans (Abs): 0 10*3/uL (ref 0.0–0.1)
Immature Granulocytes: 0 %
Lymphocytes Absolute: 1.9 10*3/uL (ref 0.7–3.1)
Lymphs: 43 %
MCH: 30.8 pg (ref 26.6–33.0)
MCHC: 33.3 g/dL (ref 31.5–35.7)
MCV: 92 fL (ref 79–97)
Monocytes Absolute: 0.5 10*3/uL (ref 0.1–0.9)
Monocytes: 11 %
Neutrophils Absolute: 1.9 10*3/uL (ref 1.4–7.0)
Neutrophils: 41 %
Platelets: 127 10*3/uL — ABNORMAL LOW (ref 150–450)
RBC: 3.96 x10E6/uL (ref 3.77–5.28)
RDW: 13.4 % (ref 11.7–15.4)
WBC: 4.5 10*3/uL (ref 3.4–10.8)

## 2021-09-26 LAB — BASIC METABOLIC PANEL
BUN/Creatinine Ratio: 22 (ref 12–28)
BUN: 18 mg/dL (ref 8–27)
CO2: 28 mmol/L (ref 20–29)
Calcium: 10.1 mg/dL (ref 8.7–10.3)
Chloride: 101 mmol/L (ref 96–106)
Creatinine, Ser: 0.81 mg/dL (ref 0.57–1.00)
Glucose: 89 mg/dL (ref 70–99)
Potassium: 4.5 mmol/L (ref 3.5–5.2)
Sodium: 140 mmol/L (ref 134–144)
eGFR: 76 mL/min/{1.73_m2} (ref >59)

## 2021-09-26 LAB — POCT INR: INR: 2.6 (ref 2.0–3.0)

## 2021-09-26 NOTE — Telephone Encounter (Signed)
Pt was here for INR check this am, saw Dr. Ladona Ridgel afterwards.   ?Pt was scheduled for pacer change out on 10/24/21, w/ instructions to  ? ? ?"Do NOT take your WARFARIN for 2 days prior to your procedure.  Your last dose will be Oct 22, 2021 your PM dose.'" ? ?Called pt to move her appt for INR check to week after her procedure.  ?Left message for her to call back.  ?

## 2021-09-26 NOTE — Telephone Encounter (Signed)
Called and spoke with pt's spouse. Appt re-scheduled for INR to be checked on 10/31/21, 1 week post procedure. Verbalized understanding.  ?

## 2021-09-26 NOTE — Progress Notes (Signed)
? ? ? ? ?HPI ?Pamela Walker returns today for followup. She is a pleasant 76 yo woman with MV replacement, CHB, s/p PPM insertion. She has done well in the interim. She denies chest pain or sob. She remains on chronic coumadin therapy. She is at [facility]. Her Sorin PM is working normally. She has chronic atrial fib. She notes that she has done well in the interim.  ?No Known Allergies ? ? ?Current Outpatient Medications  ?Medication Sig Dispense Refill  ? amoxicillin (AMOXIL) 500 MG tablet Take 4 tablets (2 g) 30-60 minutes prior to dental appointment/prodedure 4 tablet 3  ? atorvastatin (LIPITOR) 40 MG tablet Take 1 tablet by mouth daily.    ? carvedilol (COREG) 6.25 MG tablet Take 1 tablet (6.25 mg total) by mouth 2 (two) times daily. 180 tablet 3  ? levETIRAcetam (KEPPRA) 500 MG tablet Take 1 tablet (500 mg total) by mouth 2 (two) times daily. 60 tablet 0  ? lisinopril (PRINIVIL,ZESTRIL) 5 MG tablet Take 1 tablet (5 mg total) by mouth daily. 90 tablet 2  ? spironolactone (ALDACTONE) 25 MG tablet Take 25 mg by mouth as directed. MWF    ? warfarin (COUMADIN) 7.5 MG tablet Take 1 tablet to 1 1/2 tablets by mouth daily as directed by the coumadin clinic. 120 tablet 0  ? ?No current facility-administered medications for this visit.  ? ? ? ?Past Medical History:  ?Diagnosis Date  ? Atrial fibrillation (HCC)   ? on coumadin  ? DM (diabetes mellitus) (HCC)   ? Hyperlipidemia   ? Rheumatic fever/heart disease   ? Seizure (HCC)   ? Stroke [facility]) 2017  ? ICH  ? ? ?ROS: ? ? All systems reviewed and negative except as noted in the HPI. ? ? ?Past Surgical History:  ?Procedure Laterality Date  ? APPENDECTOMY    ? CESAREAN SECTION    ? INSERT / REPLACE / REMOVE PACEMAKER    ? MITRAL VALVE REPLACEMENT    ? ? ? ?Family History  ?Problem Relation Age of Onset  ? Heart disease Mother   ?     angina  ? Arrhythmia Sister   ?     Pacer  ? ? ? ?Social History  ? ?Socioeconomic History  ? Marital status: Married  ?  Spouse name: Not on file  ?  Number of children: Not on file  ? Years of education: Not on file  ? Highest education level: Not on file  ?Occupational History  ? Not on file  ?Tobacco Use  ? Smoking status: Former  ?  Packs/day: 2.00  ?  Years: 10.00  ?  Pack years: 20.00  ?  Types: Cigarettes  ?  Quit date: 8  ?  Years since quitting: 43.3  ? Smokeless tobacco: Never  ?Substance and Sexual Activity  ? Alcohol use: No  ?  Alcohol/week: 0.0 standard drinks  ?  Comment: 1 glass of wine every few months.  ? Drug use: No  ? Sexual activity: Not on file  ?Other Topics Concern  ? Not on file  ?Social History Narrative  ? Not on file  ? ?Social Determinants of Health  ? ?Financial Resource Strain: Not on file  ?Food Insecurity: Not on file  ?Transportation Needs: Not on file  ?Physical Activity: Not on file  ?Stress: Not on file  ?Social Connections: Not on file  ?Intimate Partner Violence: Not on file  ? ? ? ?BP 138/88   Pulse 84   Ht 5'  3" (1.6 m)   Wt 132 lb (59.9 kg)   BMI 23.38 kg/m?  ? ?Physical Exam: ? ?Well appearing NAD ?HEENT: Unremarkable ?Neck:  No JVD, no thyromegally ?Lymphatics:  No adenopathy ?Back:  No CVA tenderness ?Lungs:  Clear with no wheezes ?HEART:  Regular rate rhythm, no murmurs, no rubs, no clicks ?Abd:  soft, positive bowel sounds, no organomegally, no rebound, no guarding ?Ext:  2 plus pulses, no edema, no cyanosis, no clubbing ?Skin:  No rashes no nodules ?Neuro:  CN II through XII intact, motor grossly intact ? ?EKG - atrial fib with ventricular pacing ? ?DEVICE  ?Normal device function.  See PaceArt for details.  ? ?Assess/Plan:  ?1. CHB - she is asymptomatic, s/p PPM. No escape at 30. She has reached ERI and will undergo PM gen change out.  ?2. Atrial fib - she is chronically in atrial fib. Her rates are well controlled. ?3. MV replacement - her valve on exam appears to be working normally.  ?4. coags - she has had no bleeding on coumadin. She gets her INR checked in our coumadin clinic. ?5. Claudication - she  will undergo duplex dopplers.  ?  ?Pamela Gowda Janeshia Ciliberto,MD ?

## 2021-09-26 NOTE — Patient Instructions (Addendum)
Medication Instructions:  Your physician recommends that you continue on your current medications as directed. Please refer to the Current Medication list given to you today.  Labwork: You will get lab work today:  CBC and BMP  Testing/Procedures: None ordered.  Follow-Up:  SEE INSTRUCTION LETTER  Any Other Special Instructions Will Be Listed Below (If Applicable).  If you need a refill on your cardiac medications before your next appointment, please call your pharmacy.   Pacemaker Battery Change A pacemaker battery usually lasts 5-15 years (6-7 years on average). A few times a year, you may be asked to visit your health care provider to have a full evaluation of your pacemaker. When the battery is low, your pacemaker will be completely replaced. Most often, this procedure is simpler than the first surgery because the wires (leads) that connect the pacemaker to the heart are already in place. There are many things that affect how long a pacemaker battery will last, including: The age of the pacemaker. The number of leads you have(1, 2, or 3). The use or workload of the pacemaker. If the pacemaker is helping the heart more often, the battery will not last as long. Power (voltage) settings. Tell a health care provider about: Any allergies you have. All medicines you are taking, including vitamins, herbs, eye drops, creams, and over-the-counter medicines. Any problems you or family members have had with anesthetic medicines. Any blood disorders you have. Any surgeries you have had, especially any surgeries you have had since your last pacemaker was placed. Any medical conditions you have. Whether you are pregnant or may be pregnant. What are the risks? Generally, this is a safe procedure. However, problems may occur, including: Bleeding. Infection. Nerve damage. Allergic reaction to medicines. Damage to the leads that go to the heart. What happens before the procedure? Staying  hydrated Follow instructions from your health care provider about hydration, which may include: Up to 2 hours before the procedure - you may continue to drink clear liquids, such as water, clear fruit juice, black coffee, and plain tea. Eating and drinking restrictions Follow instructions from your health care provider about eating and drinking restrictions, which may include: 8 hours before the procedure - stop eating heavy meals or foods, such as meat, fried foods, or fatty foods. 6 hours before the procedure - stop eating light meals or foods, such as toast or cereal. 6 hours before the procedure - stop drinking milk or drinks that contain milk. 2 hours before the procedure - stop drinking clear liquids. Medicines Ask your health care provider about: Changing or stopping your regular medicines. This is especially important if you are taking diabetes medicines or blood thinners. Taking medicines such as aspirin and ibuprofen. These medicines can thin your blood. Do not take these medicines unless your health care provider tells you to take them. Taking over-the-counter medicines, vitamins, herbs, and supplements. General instructions Ask your health care provider what steps will be taken to help prevent infection. These may include: Removing hair at the surgery site. Washing skin with a germ-killing soap. Receiving antibiotic medicine. Plan to have someone take you home from the hospital or clinic. If you will be going home right after the procedure, plan to have someone with you for 24 hours. What happens during the procedure? An IV will be inserted into one of your veins. You will be given one or more of the following: A medicine to help you relax (sedative). A medicine to numb the area where the   pacemaker is located (local anesthetic). Your health care provider will make an incision to reopen the pocket holding the pacemaker. The old pacemaker will be disconnected from the leads. The  leads will be tested. If needed, the leads will be replaced. If the leads are functioning properly, the new pacemaker will be connected to the existing leads. A heart monitor and a pacemaker programmer will be used to make sure that the newly implanted pacemaker is working properly. The incision site will be closed with stitches (sutures), adhesive strips, or skin glue. A bandage (dressing) will be placed over the pacemaker site. The procedure may vary among health care providers and hospitals. What happens after the procedure? Your blood pressure, heart rate, breathing rate, and blood oxygen level will be monitored until you leave the hospital or clinic. You may be given antibiotics. Your health care provider will tell you when your pacemaker will need to be tested again, or when to return to the office for removal of the dressing and sutures. If you were given a sedative during the procedure, it can affect you for several hours. Do not drive or operate machinery until your health care provider says that it is safe. You will be given a pacemaker identification card. This card lists the implant date, device model, and manufacturer of your pacemaker. Summary A pacemaker battery usually lasts 5-15 years (6-7 years on average). When the battery is low, your pacemaker will need to be replaced. Most often, this procedure is simpler than the first surgery because the wires (leads) that connect the pacemaker to the heart are already in place. Risks of this procedure include bleeding, infection, and allergic reactions to medicines. This information is not intended to replace advice given to you by your health care provider. Make sure you discuss any questions you have with your health care provider. Document Revised: 05/08/2019 Document Reviewed: 05/08/2019 Elsevier Patient Education  2022 Elsevier Inc.     

## 2021-09-26 NOTE — H&P (View-Only) (Signed)
? ? ? ? ?HPI ?Pamela Walker returns today for followup. She is a pleasant 75 yo woman with MV replacement, CHB, s/p PPM insertion. She has done well in the interim. She denies chest pain or sob. She remains on chronic coumadin therapy. She is at ERI. Her Sorin PM is working normally. She has chronic atrial fib. She notes that she has done well in the interim.  ?No Known Allergies ? ? ?Current Outpatient Medications  ?Medication Sig Dispense Refill  ? amoxicillin (AMOXIL) 500 MG tablet Take 4 tablets (2 g) 30-60 minutes prior to dental appointment/prodedure 4 tablet 3  ? atorvastatin (LIPITOR) 40 MG tablet Take 1 tablet by mouth daily.    ? carvedilol (COREG) 6.25 MG tablet Take 1 tablet (6.25 mg total) by mouth 2 (two) times daily. 180 tablet 3  ? levETIRAcetam (KEPPRA) 500 MG tablet Take 1 tablet (500 mg total) by mouth 2 (two) times daily. 60 tablet 0  ? lisinopril (PRINIVIL,ZESTRIL) 5 MG tablet Take 1 tablet (5 mg total) by mouth daily. 90 tablet 2  ? spironolactone (ALDACTONE) 25 MG tablet Take 25 mg by mouth as directed. MWF    ? warfarin (COUMADIN) 7.5 MG tablet Take 1 tablet to 1 1/2 tablets by mouth daily as directed by the coumadin clinic. 120 tablet 0  ? ?No current facility-administered medications for this visit.  ? ? ? ?Past Medical History:  ?Diagnosis Date  ? Atrial fibrillation (HCC)   ? on coumadin  ? DM (diabetes mellitus) (HCC)   ? Hyperlipidemia   ? Rheumatic fever/heart disease   ? Seizure (HCC)   ? Stroke (HCC) 2017  ? ICH  ? ? ?ROS: ? ? All systems reviewed and negative except as noted in the HPI. ? ? ?Past Surgical History:  ?Procedure Laterality Date  ? APPENDECTOMY    ? CESAREAN SECTION    ? INSERT / REPLACE / REMOVE PACEMAKER    ? MITRAL VALVE REPLACEMENT    ? ? ? ?Family History  ?Problem Relation Age of Onset  ? Heart disease Mother   ?     angina  ? Arrhythmia Sister   ?     Pacer  ? ? ? ?Social History  ? ?Socioeconomic History  ? Marital status: Married  ?  Spouse name: Not on file  ?  Number of children: Not on file  ? Years of education: Not on file  ? Highest education level: Not on file  ?Occupational History  ? Not on file  ?Tobacco Use  ? Smoking status: Former  ?  Packs/day: 2.00  ?  Years: 10.00  ?  Pack years: 20.00  ?  Types: Cigarettes  ?  Quit date: 1980  ?  Years since quitting: 43.3  ? Smokeless tobacco: Never  ?Substance and Sexual Activity  ? Alcohol use: No  ?  Alcohol/week: 0.0 standard drinks  ?  Comment: 1 glass of wine every few months.  ? Drug use: No  ? Sexual activity: Not on file  ?Other Topics Concern  ? Not on file  ?Social History Narrative  ? Not on file  ? ?Social Determinants of Health  ? ?Financial Resource Strain: Not on file  ?Food Insecurity: Not on file  ?Transportation Needs: Not on file  ?Physical Activity: Not on file  ?Stress: Not on file  ?Social Connections: Not on file  ?Intimate Partner Violence: Not on file  ? ? ? ?BP 138/88   Pulse 84   Ht 5'   3" (1.6 m)   Wt 132 lb (59.9 kg)   BMI 23.38 kg/m?  ? ?Physical Exam: ? ?Well appearing NAD ?HEENT: Unremarkable ?Neck:  No JVD, no thyromegally ?Lymphatics:  No adenopathy ?Back:  No CVA tenderness ?Lungs:  Clear with no wheezes ?HEART:  Regular rate rhythm, no murmurs, no rubs, no clicks ?Abd:  soft, positive bowel sounds, no organomegally, no rebound, no guarding ?Ext:  2 plus pulses, no edema, no cyanosis, no clubbing ?Skin:  No rashes no nodules ?Neuro:  CN II through XII intact, motor grossly intact ? ?EKG - atrial fib with ventricular pacing ? ?DEVICE  ?Normal device function.  See PaceArt for details.  ? ?Assess/Plan:  ?1. CHB - she is asymptomatic, s/p PPM. No escape at 30. She has reached ERI and will undergo PM gen change out.  ?2. Atrial fib - she is chronically in atrial fib. Her rates are well controlled. ?3. MV replacement - her valve on exam appears to be working normally.  ?4. coags - she has had no bleeding on coumadin. She gets her INR checked in our coumadin clinic. ?5. Claudication - she  will undergo duplex dopplers.  ?  ?Pamela Gowda [name],MD ?

## 2021-09-26 NOTE — Patient Instructions (Addendum)
Description   ?- continue taking 1 tablet daily except for 1.5 tablets on Saturdays.  ?- Recheck INR in 1 week post PPM GENERATOR CHANGEOUT .  ?Coumadin Clinic [phone].  ?Keep your greens intake consistent. ?  ?   ?

## 2021-10-24 NOTE — Pre-Procedure Instructions (Addendum)
Instructed patient on the following items: ?Arrival time 0930 ?Nothing to eat or drink after midnight ?No meds AM of procedure ?Responsible person to drive you home and stay with you for 24 hrs ?Wash with special soap night before and morning of procedure ?If on anti-coagulant drug instructions Coumadin- last dose 5/6  ?

## 2021-10-25 ENCOUNTER — Ambulatory Visit (HOSPITAL_COMMUNITY)
Admission: RE | Disposition: A | Discharge status: Home / Self Care | Payer: Self-pay | Source: Home / Self Care | Attending: Internal Medicine

## 2021-10-25 ENCOUNTER — Ambulatory Visit (HOSPITAL_COMMUNITY)
Admission: RE | Admit: 2021-10-25 | Discharge: 2021-10-25 | Disposition: A | Discharge status: Home / Self Care | Payer: Medicare Other | Attending: Internal Medicine | Admitting: Internal Medicine

## 2021-10-25 ENCOUNTER — Other Ambulatory Visit: Payer: Self-pay

## 2021-10-25 DIAGNOSIS — I482 Chronic atrial fibrillation, unspecified: Secondary | ICD-10-CM | POA: Insufficient documentation

## 2021-10-25 DIAGNOSIS — Z7901 Long term (current) use of anticoagulants: Secondary | ICD-10-CM | POA: Insufficient documentation

## 2021-10-25 DIAGNOSIS — Z45010 Encounter for checking and testing of cardiac pacemaker pulse generator [battery]: Secondary | ICD-10-CM | POA: Insufficient documentation

## 2021-10-25 DIAGNOSIS — E1151 Type 2 diabetes mellitus with diabetic peripheral angiopathy without gangrene: Secondary | ICD-10-CM | POA: Diagnosis not present

## 2021-10-25 DIAGNOSIS — I442 Atrioventricular block, complete: Secondary | ICD-10-CM | POA: Diagnosis not present

## 2021-10-25 HISTORY — PX: PPM GENERATOR CHANGEOUT: EP1233

## 2021-10-25 LAB — GLUCOSE, CAPILLARY
Glucose-Capillary: 90 mg/dL (ref 70–99)
Glucose-Capillary: 111 mg/dL — ABNORMAL HIGH (ref 70–99)

## 2021-10-25 LAB — PROTIME-INR
INR: 1.8 — ABNORMAL HIGH (ref 0.8–1.2)
Prothrombin Time: 21 seconds — ABNORMAL HIGH (ref 11.4–15.2)

## 2021-10-25 SURGERY — PPM GENERATOR CHANGEOUT

## 2021-10-25 MED ORDER — CEFAZOLIN SODIUM-DEXTROSE 2-4 GM/100ML-% IV SOLN
2.0000 g | INTRAVENOUS | Status: AC
  Administered 2021-10-25: 2 g via INTRAVENOUS
  Filled 2021-10-25: qty 100

## 2021-10-25 MED ORDER — SODIUM CHLORIDE 0.9 % IV SOLN
80.0000 mg | INTRAVENOUS | Status: AC
  Administered 2021-10-25: 80 mg
  Filled 2021-10-25: qty 2

## 2021-10-25 MED ORDER — ACETAMINOPHEN 325 MG PO TABS
325.0000 mg | ORAL_TABLET | ORAL | Status: DC | PRN
  Filled 2021-10-25: qty 2

## 2021-10-25 MED ORDER — LIDOCAINE HCL 1 % IJ SOLN
INTRAMUSCULAR | Status: AC
Start: 2021-10-25 — End: ?
  Filled 2021-10-25: qty 20

## 2021-10-25 MED ORDER — MIDAZOLAM HCL 5 MG/5ML IJ SOLN
INTRAMUSCULAR | Status: DC | PRN
  Administered 2021-10-25 (×2): 1 mg via INTRAVENOUS
  Administered 2021-10-25: 2 mg via INTRAVENOUS

## 2021-10-25 MED ORDER — CHLORHEXIDINE GLUCONATE 4 % EX LIQD
4.0000 | Freq: Once | CUTANEOUS | Status: DC
Start: 2021-10-25 — End: 2021-10-25
  Filled 2021-10-25: qty 60

## 2021-10-25 MED ORDER — ONDANSETRON HCL 4 MG/2ML IJ SOLN: 4.0000 mg | Freq: Four times a day (QID) | INTRAMUSCULAR | Status: DC | PRN

## 2021-10-25 MED ORDER — SODIUM CHLORIDE 0.9 % IV SOLN: INTRAVENOUS | Status: DC

## 2021-10-25 MED ORDER — FENTANYL CITRATE (PF) 100 MCG/2ML IJ SOLN
INTRAMUSCULAR | Status: AC
Start: 2021-10-25 — End: ?
  Filled 2021-10-25: qty 2

## 2021-10-25 MED ORDER — LIDOCAINE HCL 1 % IJ SOLN
INTRAMUSCULAR | Status: AC
  Filled 2021-10-25: qty 20

## 2021-10-25 MED ORDER — LIDOCAINE HCL (PF) 1 % IJ SOLN
INTRAMUSCULAR | Status: DC | PRN
  Administered 2021-10-25: 60 mL

## 2021-10-25 MED ORDER — SODIUM CHLORIDE 0.9 % IV SOLN
INTRAVENOUS | Status: AC
  Filled 2021-10-25: qty 2

## 2021-10-25 MED ORDER — FENTANYL CITRATE (PF) 100 MCG/2ML IJ SOLN
INTRAMUSCULAR | Status: DC | PRN
  Administered 2021-10-25: 12.5 ug via INTRAVENOUS
  Administered 2021-10-25: 25 ug via INTRAVENOUS

## 2021-10-25 MED ORDER — POVIDONE-IODINE 10 % EX SWAB
2.0000 "application " | Freq: Once | CUTANEOUS | Status: AC
  Administered 2021-10-25: 2 via TOPICAL

## 2021-10-25 MED ORDER — CEFAZOLIN SODIUM-DEXTROSE 2-4 GM/100ML-% IV SOLN
INTRAVENOUS | Status: AC
  Filled 2021-10-25: qty 100

## 2021-10-25 MED ORDER — MIDAZOLAM HCL 5 MG/5ML IJ SOLN
INTRAMUSCULAR | Status: AC
  Filled 2021-10-25: qty 5

## 2021-10-25 SURGICAL SUPPLY — 7 items
CABLE SURGICAL S-101-97-12 (CABLE) ×2 IMPLANT
IPG PACE AZUR XT SR MRI W1SR01 (Pacemaker) IMPLANT
PACE AZURE XT SR MRI W1SR01 (Pacemaker) ×2 IMPLANT
PAD DEFIB RADIO PHYSIO CONN (PAD) ×2 IMPLANT
POUCH AIGIS-R ANTIBACT PPM (Mesh General) ×2 IMPLANT
POUCH AIGIS-R ANTIBACT PPM MED (Mesh General) IMPLANT
TRAY PACEMAKER INSERTION (PACKS) ×2 IMPLANT

## 2021-10-25 NOTE — Interval H&P Note (Signed)
History and Physical Interval Note: ? ?10/25/2021 ?11:25 AM ? ?Pamela Walker  has presented today for surgery, with the diagnosis of eri.  The various methods of treatment have been discussed with the patient and family. After consideration of risks, benefits and other options for treatment, the patient has consented to  Procedure(s): ?PPM GENERATOR CHANGEOUT (N/A) as a surgical intervention.  The patient's history has been reviewed, patient examined, no change in status, stable for surgery.  I have reviewed the patient's chart and labs.  Questions were answered to the patient's satisfaction.   ? ? ?[name] ? ? ?

## 2021-10-25 NOTE — Discharge Instructions (Addendum)
DO NOT REMOVE DRESSING. DRESSING WILL BE REMOVED AT THE DOCTOR'S OFFICE ON 5/11-CALL OFFICE TO VERIFY APPOINTMENT ?

## 2021-10-25 NOTE — Progress Notes (Signed)
Per Dr Lenon Oms post EKG or telemetry-pt to start coumadin today-to office 5/11 for dressing change ?

## 2021-10-26 ENCOUNTER — Encounter (HOSPITAL_COMMUNITY): Payer: Self-pay | Admitting: Internal Medicine

## 2021-10-26 MED FILL — Lidocaine HCl Local Preservative Free (PF) Inj 1%: INTRAMUSCULAR | Qty: 30 | Status: AC

## 2021-10-27 ENCOUNTER — Ambulatory Visit (INDEPENDENT_AMBULATORY_CARE_PROVIDER_SITE_OTHER): Payer: Medicare Other

## 2021-10-27 DIAGNOSIS — I442 Atrioventricular block, complete: Secondary | ICD-10-CM

## 2021-10-27 NOTE — Progress Notes (Signed)
Device clinic appointment today for pressure dressing removal s/p [employer] PPM gen change 10/25/21. Patient has resumed her coumadin as instructed. Pressure dressing and outer dressing removed without difficulty. Steristrips intact. No new bleeding or hematoma noted. Arm restrictions, showering restrictions, and wound care instructions provided in [place] and spanish. Patient is provided device clinic contact for additional questions or concerns that may arise. Patient is scheduled to return to device clinic for wound check appointment 11/09/21 at 12:00. Patient and husband verbalize understanding.  ?

## 2021-10-27 NOTE — Patient Instructions (Signed)
Contin?e siguiendo las instrucciones de alta que se le proporcionaron en el hospital. ? ?No se duche ni lave la incisi?n. ? ?Controle si hay sangrado o hinchaz?n. Puede usar una compresa de hielo en la incisi?n para mayor comodidad y para reducir la hinchaz?n. ? ?Puede tomar Tylenol seg?n sea necesario para el dolor o la incomodidad. ? ?Llame a la cl?nica del dispositivo con cualquier pregunta, inquietud, hinchaz?n extrema o sangrado al [phone] ? ? ? ? ?

## 2021-10-31 ENCOUNTER — Ambulatory Visit: Payer: Medicare Other | Admitting: *Deleted

## 2021-10-31 DIAGNOSIS — Z952 Presence of prosthetic heart valve: Secondary | ICD-10-CM | POA: Diagnosis not present

## 2021-10-31 DIAGNOSIS — I4891 Unspecified atrial fibrillation: Secondary | ICD-10-CM | POA: Diagnosis not present

## 2021-10-31 DIAGNOSIS — Z5181 Encounter for therapeutic drug level monitoring: Secondary | ICD-10-CM | POA: Diagnosis not present

## 2021-10-31 LAB — POCT INR: INR: 1.9 — AB (ref 2.0–3.0)

## 2021-10-31 NOTE — Patient Instructions (Signed)
Description   ?Today and tomorrow take 1.5 tablets then continue taking 1 tablet daily except for 1.5 tablets on Saturdays. Recheck INR in 1 week. Coumadin Clinic [phone]. Keep your greens intake consistent. ?  ?  ?

## 2021-11-09 ENCOUNTER — Ambulatory Visit: Payer: Medicare Other

## 2021-11-10 ENCOUNTER — Ambulatory Visit: Payer: Medicare Other | Admitting: *Deleted

## 2021-11-10 ENCOUNTER — Ambulatory Visit (INDEPENDENT_AMBULATORY_CARE_PROVIDER_SITE_OTHER): Payer: Medicare Other

## 2021-11-10 DIAGNOSIS — Z5181 Encounter for therapeutic drug level monitoring: Secondary | ICD-10-CM | POA: Diagnosis not present

## 2021-11-10 DIAGNOSIS — I4891 Unspecified atrial fibrillation: Secondary | ICD-10-CM | POA: Diagnosis not present

## 2021-11-10 DIAGNOSIS — I442 Atrioventricular block, complete: Secondary | ICD-10-CM | POA: Diagnosis not present

## 2021-11-10 DIAGNOSIS — Z952 Presence of prosthetic heart valve: Secondary | ICD-10-CM | POA: Diagnosis not present

## 2021-11-10 LAB — CUP PACEART INCLINIC DEVICE CHECK
Battery Remaining Longevity: 162 mo
Battery Voltage: 3.22 V
Brady Statistic RV Percent Paced: 99.99 %
Date Time Interrogation Session: 20230525101837
Implantable Lead Implant Date: 20120120
Implantable Lead Implant Date: 20120120
Implantable Lead Location: 753859
Implantable Lead Location: 753860
Implantable Pulse Generator Implant Date: 20230509
Lead Channel Impedance Value: 380 Ohm
Lead Channel Impedance Value: 532 Ohm
Lead Channel Pacing Threshold Amplitude: 0.75 V
Lead Channel Pacing Threshold Pulse Width: 0.4 ms
Lead Channel Setting Pacing Amplitude: 2 V
Lead Channel Setting Pacing Pulse Width: 0.4 ms
Lead Channel Setting Sensing Sensitivity: 4 mV

## 2021-11-10 LAB — POCT INR: INR: 3.4 — AB (ref 2.0–3.0)

## 2021-11-10 NOTE — Progress Notes (Signed)
Wound check appointment s/p PPM gen change 10/25/21. Steri-strips removed. Wound without redness or edema. Incision edges approximated, wound well healed. Normal device function. Thresholds, sensing, and impedances consistent with implant measurements. Device programmed at voltage for chronic lead settings. Histogram distribution appropriate for patient and level of activity. No high ventricular rates noted. Patient educated about wound care, arm mobility, lifting restrictions. Patient enrolled in remote monitoring with next transmission scheduled 01/27/22. 91 day follow up with Dr. Ladona Ridgel 02/06/22.

## 2021-11-10 NOTE — Patient Instructions (Addendum)
Despus de su marcapasos    Controle el sitio de su marcapasos para Counsellor, hinchazn y drenaje. Llame a la clnica de dispositivos al [phone] si experimenta estos sntomas o fiebre/escalofros.   Su incisin se cerr con Steri-strips o grapas: Puede ducharse y lavarse la incisin con agua y [place]. Evite las lociones, los ungentos o los perfumes sobre la incisin hasta que sane bien.   Puede usar un jacuzzi o una piscina despus de su cita de revisin de heridas si la incisin est completamente cerrada.   No levante, empuje o jale ms de 10 libras con el brazo afectado hasta 6 semanas despus de su procedimiento. No hay otras restricciones en el movimiento del brazo despus de su cita de revisin de heridas. junio 21   Puede conducir, a menos que sus proveedores de atencin mdica le hayan restringido la conduccin.   Su marcapasos no es compatible con MRI.   La monitorizacin remota se [place] para controlar su marcapasos desde casa. Este monitoreo est programado cada [address] por nuestra oficina. Nos permite vigilar el funcionamiento de su dispositivo para asegurarnos de que funciona correctamente. Ver a su electrofisilogo de forma rutinaria anualmente (ms a menudo si es necesario).     After Your Pacemaker   Monitor your pacemaker site for redness, swelling, and drainage. Call the device clinic at [phone] if you experience these symptoms or fever/chills.  Your incision was closed with Steri-strips or staples:  You may shower and wash your incision with soap and water. Avoid lotions, ointments, or perfumes over your incision until it is well-healed.  You may use a hot tub or a pool after your wound check appointment if the incision is completely closed.  Do not lift, push or pull greater than 10 pounds with the affected arm until 6 weeks after your procedure. There are no other restrictions in arm movement after your wound check appointment. June  21  You may drive, unless driving has been restricted by your healthcare providers.  Your Pacemaker is not MRI compatible.  Remote monitoring is used to monitor your pacemaker from home. This monitoring is scheduled every 91 days by our office. It allows [place] to keep an eye on the functioning of your device to ensure it is working properly. You will routinely see your Electrophysiologist annually (more often if necessary).

## 2021-11-10 NOTE — Patient Instructions (Signed)
Description   Continue taking 1 tablet daily except for 1.5 tablets on Saturdays. Recheck INR in 3 weeks. Coumadin Clinic [phone]. Keep your greens intake consistent.

## 2021-11-28 ENCOUNTER — Ambulatory Visit (INDEPENDENT_AMBULATORY_CARE_PROVIDER_SITE_OTHER): Payer: Medicare Other | Admitting: *Deleted

## 2021-11-28 DIAGNOSIS — Z5181 Encounter for therapeutic drug level monitoring: Secondary | ICD-10-CM | POA: Diagnosis not present

## 2021-11-28 DIAGNOSIS — Z952 Presence of prosthetic heart valve: Secondary | ICD-10-CM | POA: Diagnosis not present

## 2021-11-28 DIAGNOSIS — I4891 Unspecified atrial fibrillation: Secondary | ICD-10-CM | POA: Diagnosis not present

## 2021-11-28 LAB — POCT INR: INR: 2.7 (ref 2.0–3.0)

## 2021-11-28 NOTE — Patient Instructions (Signed)
Description   Continue taking 1 tablet daily except for 1.5 tablets on Saturdays. Recheck INR in 4 weeks. Coumadin Clinic [phone]. Keep your greens intake consistent.

## 2021-12-04 ENCOUNTER — Other Ambulatory Visit: Payer: Self-pay | Admitting: Internal Medicine

## 2021-12-04 DIAGNOSIS — Z952 Presence of prosthetic heart valve: Secondary | ICD-10-CM

## 2021-12-04 DIAGNOSIS — I4891 Unspecified atrial fibrillation: Secondary | ICD-10-CM

## 2021-12-04 DIAGNOSIS — Z5181 Encounter for therapeutic drug level monitoring: Secondary | ICD-10-CM

## 2021-12-26 ENCOUNTER — Ambulatory Visit: Payer: Medicare Other | Admitting: *Deleted

## 2021-12-26 DIAGNOSIS — Z952 Presence of prosthetic heart valve: Secondary | ICD-10-CM | POA: Diagnosis not present

## 2021-12-26 DIAGNOSIS — I4891 Unspecified atrial fibrillation: Secondary | ICD-10-CM

## 2021-12-26 DIAGNOSIS — Z5181 Encounter for therapeutic drug level monitoring: Secondary | ICD-10-CM

## 2021-12-26 LAB — POCT INR: INR: 2.6 (ref 2.0–3.0)

## 2021-12-26 NOTE — Patient Instructions (Signed)
Description   Continue taking 1 tablet daily except for 1.5 tablets on Saturdays. Recheck INR in 6 weeks. Coumadin Clinic [phone]. Keep your greens intake consistent.

## 2022-01-27 ENCOUNTER — Ambulatory Visit (INDEPENDENT_AMBULATORY_CARE_PROVIDER_SITE_OTHER): Payer: Medicare Other

## 2022-01-27 DIAGNOSIS — I4891 Unspecified atrial fibrillation: Secondary | ICD-10-CM | POA: Diagnosis not present

## 2022-01-27 LAB — CUP PACEART REMOTE DEVICE CHECK
Battery Remaining Longevity: 157 mo
Battery Voltage: 3.2 V
Brady Statistic RV Percent Paced: 99.99 %
Date Time Interrogation Session: 20230810194103
Implantable Lead Implant Date: 20120120
Implantable Lead Implant Date: 20120120
Implantable Lead Location: 753859
Implantable Lead Location: 753860
Implantable Pulse Generator Implant Date: 20230509
Lead Channel Impedance Value: 361 Ohm
Lead Channel Impedance Value: 475 Ohm
Lead Channel Pacing Threshold Amplitude: 0.75 V
Lead Channel Pacing Threshold Pulse Width: 0.4 ms
Lead Channel Setting Pacing Amplitude: 2 V
Lead Channel Setting Pacing Pulse Width: 0.4 ms
Lead Channel Setting Sensing Sensitivity: 4 mV

## 2022-02-06 ENCOUNTER — Ambulatory Visit: Payer: Medicare Other | Admitting: Internal Medicine

## 2022-02-06 ENCOUNTER — Ambulatory Visit: Payer: Medicare Other

## 2022-02-06 ENCOUNTER — Encounter: Payer: Self-pay | Admitting: Internal Medicine

## 2022-02-06 VITALS — BP 108/66 | HR 74 | Ht 63.0 in | Wt 128.6 lb

## 2022-02-06 DIAGNOSIS — I4891 Unspecified atrial fibrillation: Secondary | ICD-10-CM

## 2022-02-06 DIAGNOSIS — I442 Atrioventricular block, complete: Secondary | ICD-10-CM

## 2022-02-06 DIAGNOSIS — Z950 Presence of cardiac pacemaker: Secondary | ICD-10-CM | POA: Diagnosis not present

## 2022-02-06 DIAGNOSIS — Z5181 Encounter for therapeutic drug level monitoring: Secondary | ICD-10-CM

## 2022-02-06 DIAGNOSIS — Z952 Presence of prosthetic heart valve: Secondary | ICD-10-CM

## 2022-02-06 LAB — POCT INR: INR: 4.2 — AB (ref 2.0–3.0)

## 2022-02-06 NOTE — Progress Notes (Signed)
HPI Mrs. Floren returns today for followup. She is a pleasant 76 yo woman with MV replacement, CHB, s/p PPM insertion. She has done well in the interim. She denies chest pain or sob. She remains on chronic coumadin therapy. She has undergone PM gen change out and is doing well. She has chronic atrial fib. She notes that she has done well in the interim.  No Known Allergies   Current Outpatient Medications  Medication Sig Dispense Refill   amoxicillin (AMOXIL) 500 MG tablet Take 4 tablets (2 g) 30-60 minutes prior to dental appointment/prodedure 4 tablet 3   Ascorbic Acid (VITAMIN C PO) Take 1 tablet by mouth daily.     atorvastatin (LIPITOR) 40 MG tablet Take 40 mg by mouth daily.     carvedilol (COREG) 6.25 MG tablet Take 1 tablet (6.25 mg total) by mouth 2 (two) times daily. 180 tablet 3   cetirizine (ZYRTEC) 10 MG tablet Take 10 mg by mouth daily.     lisinopril (PRINIVIL,ZESTRIL) 5 MG tablet Take 1 tablet (5 mg total) by mouth daily. 90 tablet 2   spironolactone (ALDACTONE) 25 MG tablet Take 25 mg by mouth every Monday, Wednesday, and Friday.     Vitamin D3 (VITAMIN D) 25 MCG tablet Take 1,000 Units by mouth daily.     warfarin (COUMADIN) 7.5 MG tablet TOME 1 A 1 Y 1/2 TABLETA POR LA  BOCA CADA DIA COMO SE INDICO  POR LA CLINICA DE COUMADIN 120 tablet 1   levETIRAcetam (KEPPRA) 500 MG tablet Take 1 tablet (500 mg total) by mouth 2 (two) times daily. 60 tablet 0   No current facility-administered medications for this visit.     Past Medical History:  Diagnosis Date   Atrial fibrillation (HCC)    on coumadin   DM (diabetes mellitus) (HCC)    Hyperlipidemia    Rheumatic fever/heart disease    Seizure (HCC)    Stroke (HCC) 2017   ICH    ROS:   All systems reviewed and negative except as noted in the HPI.   Past Surgical History:  Procedure Laterality Date   APPENDECTOMY     CESAREAN SECTION     INSERT / REPLACE / REMOVE PACEMAKER     MITRAL VALVE REPLACEMENT      PPM GENERATOR CHANGEOUT N/A 10/25/2021   Procedure: PPM GENERATOR CHANGEOUT;  Surgeon: Marinus Maw, MD;  Location: [facility];  Service: Cardiovascular;  Laterality: N/A;     Family History  Problem Relation Age of Onset   Heart disease Mother        angina   Arrhythmia Sister        Pacer     Social History   Socioeconomic History   Marital status: Married    Spouse name: Not on file   Number of children: Not on file   Years of education: Not on file   Highest education level: Not on file  Occupational History   Not on file  Tobacco Use   Smoking status: Former    Packs/day: 2.00    Years: 10.00    Total pack years: 20.00    Types: Cigarettes    Quit date: 86    Years since quitting: 43.6   Smokeless tobacco: Never  Substance and Sexual Activity   Alcohol use: No    Alcohol/week: 0.0 standard drinks of alcohol    Comment: 1 glass of wine every few months.   Drug use:  No   Sexual activity: Not on file  Other Topics Concern   Not on file  Social History Narrative   Not on file   Social Determinants of Health   Financial Resource Strain: Not on file  Food Insecurity: Not on file  Transportation Needs: Not on file  Physical Activity: Not on file  Stress: Not on file  Social Connections: Not on file  Intimate Partner Violence: Not on file     BP 108/66   Pulse 74   Ht 5\' 3"  (1.6 m)   Wt 128 lb 9.6 oz (58.3 kg)   SpO2 97%   BMI 22.78 kg/m   Physical Exam:  Well appearing NAD HEENT: Unremarkable Neck:  No JVD, no thyromegally Lymphatics:  No adenopathy Back:  No CVA tenderness Lungs:  Clear HEART:  Regular rate rhythm, no murmurs, no rubs, no clicks Abd:  soft, positive bowel sounds, no organomegally, no rebound, no guarding Ext:  2 plus pulses, no edema, no cyanosis, no clubbing Skin:  No rashes no nodules Neuro:  CN II through XII intact, motor grossly intact  EKG - atrial fib with ventricular pacing  DEVICE  Normal device  function.  See PaceArt for details.   Assess/Plan:  1. CHB - she is asymptomatic, s/p PPM. No escape at 30. She has undergone PM gen change out.  2. Atrial fib - she is chronically in atrial fib. Her rates are well controlled. 3. MV replacement - her valve on exam appears to be working normally.  4. coags - she has had no bleeding on coumadin. She gets her INR checked in our coumadin clinic.   Bibiana Gillean,MD

## 2022-02-06 NOTE — Patient Instructions (Signed)
Description   Hold tomorrow's dose and then continue taking 1 tablet daily except for 1.5 tablets on Saturdays. Recheck INR in 3 weeks.  Coumadin Clinic [phone]. Keep your greens intake consistent.

## 2022-02-06 NOTE — Patient Instructions (Addendum)
Medication Instructions:  Your physician recommends that you continue on your current medications as directed. Please refer to the Current Medication list given to you today.  *If you need a refill on your cardiac medications before your next appointment, please call your pharmacy*  NO CHANGES TO YOUR MEDICATIONS.   Lab Work: None ordered.  If you have labs (blood work) drawn today and your tests are completely normal, you will receive your results only by: MyChart Message (if you have MyChart) OR A paper copy in the mail If you have any lab test that is abnormal or we need to change your treatment, we will call you to review the results.  Testing/Procedures: None ordered.  Follow-Up:  IN 1 YEAR WITH DR. GREGG TAYLOR, [facility] [address].    Remote monitoring is used to monitor your Pacemaker from home. This monitoring reduces the number of office visits required to check your device to one time per year. It allows [place] to keep an eye on the functioning of your device to ensure it is working properly. You are scheduled for a device check from home on 04/28/22. You may send your transmission at any time that day. If you have a wireless device, the transmission will be sent automatically. After your physician reviews your transmission, you will receive a postcard with your next transmission date.  Important Information About Sugar

## 2022-02-21 NOTE — Progress Notes (Signed)
Remote pacemaker transmission.   

## 2022-02-24 ENCOUNTER — Ambulatory Visit: Payer: Medicare Other | Attending: Internal Medicine

## 2022-02-24 DIAGNOSIS — I4891 Unspecified atrial fibrillation: Secondary | ICD-10-CM | POA: Diagnosis not present

## 2022-02-24 DIAGNOSIS — Z952 Presence of prosthetic heart valve: Secondary | ICD-10-CM | POA: Diagnosis not present

## 2022-02-24 DIAGNOSIS — Z5181 Encounter for therapeutic drug level monitoring: Secondary | ICD-10-CM

## 2022-02-24 LAB — POCT INR: INR: 1.8 — AB (ref 2.0–3.0)

## 2022-02-24 NOTE — Patient Instructions (Signed)
TAKE 2 TABLETS TODAY and then continue taking 1 tablet daily except for 1.5 tablets on Saturdays. Recheck INR in 2 weeks.  Coumadin Clinic [phone]. Keep your greens intake consistent

## 2022-02-27 ENCOUNTER — Other Ambulatory Visit: Payer: Self-pay | Admitting: Internal Medicine

## 2022-02-27 DIAGNOSIS — Z952 Presence of prosthetic heart valve: Secondary | ICD-10-CM

## 2022-02-27 DIAGNOSIS — Z5181 Encounter for therapeutic drug level monitoring: Secondary | ICD-10-CM

## 2022-02-27 DIAGNOSIS — I4891 Unspecified atrial fibrillation: Secondary | ICD-10-CM

## 2022-03-06 ENCOUNTER — Ambulatory Visit: Payer: Medicare Other | Attending: Internal Medicine

## 2022-03-06 DIAGNOSIS — Z952 Presence of prosthetic heart valve: Secondary | ICD-10-CM | POA: Diagnosis not present

## 2022-03-06 DIAGNOSIS — I4891 Unspecified atrial fibrillation: Secondary | ICD-10-CM | POA: Diagnosis not present

## 2022-03-06 DIAGNOSIS — Z5181 Encounter for therapeutic drug level monitoring: Secondary | ICD-10-CM | POA: Diagnosis not present

## 2022-03-06 LAB — POCT INR: INR: 3.6 — AB (ref 2.0–3.0)

## 2022-03-06 NOTE — Patient Instructions (Signed)
continue taking 1 tablet daily except for 1.5 tablets on Saturdays. Recheck INR in 4 weeks.  Coumadin Clinic [phone]. Keep your greens intake consistent.

## 2022-04-03 ENCOUNTER — Ambulatory Visit: Payer: Medicare Other

## 2022-04-05 ENCOUNTER — Ambulatory Visit: Payer: Medicare Other | Attending: Cardiology | Admitting: *Deleted

## 2022-04-05 DIAGNOSIS — Z952 Presence of prosthetic heart valve: Secondary | ICD-10-CM

## 2022-04-05 DIAGNOSIS — Z5181 Encounter for therapeutic drug level monitoring: Secondary | ICD-10-CM | POA: Diagnosis not present

## 2022-04-05 DIAGNOSIS — I4891 Unspecified atrial fibrillation: Secondary | ICD-10-CM

## 2022-04-05 LAB — POCT INR: INR: 2.2 (ref 2.0–3.0)

## 2022-04-05 NOTE — Patient Instructions (Signed)
Description   Today take another 1/2 tablet of warfarin then continue taking 1 tablet daily except for 1.5 tablets on Saturdays. Recheck INR in 4 weeks.  Coumadin Clinic [phone]. Keep your greens intake consistent.

## 2022-04-28 ENCOUNTER — Ambulatory Visit (INDEPENDENT_AMBULATORY_CARE_PROVIDER_SITE_OTHER): Payer: Medicare Other

## 2022-04-28 DIAGNOSIS — I639 Cerebral infarction, unspecified: Secondary | ICD-10-CM | POA: Diagnosis not present

## 2022-04-29 LAB — CUP PACEART REMOTE DEVICE CHECK
Battery Remaining Longevity: 156 mo
Battery Voltage: 3.16 V
Brady Statistic RV Percent Paced: 100 %
Date Time Interrogation Session: 20231109182246
Implantable Lead Connection Status: 753985
Implantable Lead Connection Status: 753985
Implantable Lead Implant Date: 20120120
Implantable Lead Implant Date: 20120120
Implantable Lead Location: 753859
Implantable Lead Location: 753860
Implantable Pulse Generator Implant Date: 20230509
Lead Channel Impedance Value: 380 Ohm
Lead Channel Impedance Value: 513 Ohm
Lead Channel Pacing Threshold Amplitude: 0.75 V
Lead Channel Pacing Threshold Pulse Width: 0.4 ms
Lead Channel Setting Pacing Amplitude: 2 V
Lead Channel Setting Pacing Pulse Width: 0.4 ms
Lead Channel Setting Sensing Sensitivity: 4 mV
Zone Setting Status: 755011

## 2022-05-01 ENCOUNTER — Ambulatory Visit: Payer: Medicare Other | Attending: Cardiology

## 2022-05-01 DIAGNOSIS — Z952 Presence of prosthetic heart valve: Secondary | ICD-10-CM | POA: Diagnosis not present

## 2022-05-01 DIAGNOSIS — I4891 Unspecified atrial fibrillation: Secondary | ICD-10-CM

## 2022-05-01 DIAGNOSIS — Z5181 Encounter for therapeutic drug level monitoring: Secondary | ICD-10-CM | POA: Diagnosis not present

## 2022-05-01 LAB — POCT INR: INR: 2.5 (ref 2.0–3.0)

## 2022-05-01 NOTE — Patient Instructions (Addendum)
continue taking 1 tablet daily except for 1.5 tablets on Saturdays. Recheck INR in 5 weeks.  Coumadin Clinic [phone]. Keep your greens intake consistent.

## 2022-05-03 NOTE — Progress Notes (Signed)
Remote pacemaker transmission.   

## 2022-06-05 ENCOUNTER — Ambulatory Visit: Payer: Medicare Other | Attending: Internal Medicine

## 2022-06-05 DIAGNOSIS — Z952 Presence of prosthetic heart valve: Secondary | ICD-10-CM

## 2022-06-05 DIAGNOSIS — Z5181 Encounter for therapeutic drug level monitoring: Secondary | ICD-10-CM | POA: Diagnosis not present

## 2022-06-05 DIAGNOSIS — I4891 Unspecified atrial fibrillation: Secondary | ICD-10-CM | POA: Diagnosis not present

## 2022-06-05 LAB — POCT INR: INR: 2.2 (ref 2.0–3.0)

## 2022-06-05 NOTE — Patient Instructions (Signed)
Description   Take 1.5 tablets today and then START taking 1 tablet daily except for 1.5 tablets on Tuesdays and Saturdays.  Recheck INR in 2 weeks.  Coumadin Clinic [phone] Keep your greens intake consistent.

## 2022-06-20 ENCOUNTER — Ambulatory Visit: Payer: Medicare Other | Attending: Internal Medicine | Admitting: *Deleted

## 2022-06-20 DIAGNOSIS — I4891 Unspecified atrial fibrillation: Secondary | ICD-10-CM | POA: Diagnosis not present

## 2022-06-20 DIAGNOSIS — Z5181 Encounter for therapeutic drug level monitoring: Secondary | ICD-10-CM

## 2022-06-20 DIAGNOSIS — Z952 Presence of prosthetic heart valve: Secondary | ICD-10-CM

## 2022-06-20 LAB — POCT INR: POC INR: 2.4

## 2022-06-20 MED ORDER — WARFARIN SODIUM 7.5 MG PO TABS: ORAL_TABLET | ORAL | 0 refills | Status: DC

## 2022-06-20 NOTE — Patient Instructions (Signed)
Description   START taking warfarin 1 tablet daily except for 1.5 tablets on Tuesday, Thursday and Saturday.  Recheck INR in 2 weeks.  Coumadin Clinic [phone] Keep your greens intake consistent.

## 2022-07-03 ENCOUNTER — Ambulatory Visit: Payer: Medicare Other | Attending: Cardiology

## 2022-07-03 DIAGNOSIS — Z952 Presence of prosthetic heart valve: Secondary | ICD-10-CM | POA: Diagnosis not present

## 2022-07-03 DIAGNOSIS — Z5181 Encounter for therapeutic drug level monitoring: Secondary | ICD-10-CM

## 2022-07-03 DIAGNOSIS — I4891 Unspecified atrial fibrillation: Secondary | ICD-10-CM | POA: Diagnosis not present

## 2022-07-03 LAB — POCT INR: INR: 3.3 — AB (ref 2.0–3.0)

## 2022-07-03 NOTE — Patient Instructions (Signed)
Continue warfarin 1 tablet daily except for 1.5 tablets on Tuesday, Thursday and Saturday.  Recheck INR in 4 weeks.  Coumadin Clinic [phone] Keep your greens intake consistent.

## 2022-07-28 ENCOUNTER — Ambulatory Visit: Payer: Medicare Other

## 2022-07-28 DIAGNOSIS — I442 Atrioventricular block, complete: Secondary | ICD-10-CM

## 2022-07-28 LAB — CUP PACEART REMOTE DEVICE CHECK
Battery Remaining Longevity: 154 mo
Battery Voltage: 3.13 V
Brady Statistic RV Percent Paced: 99.97 %
Date Time Interrogation Session: 20240208181636
Implantable Lead Connection Status: 753985
Implantable Lead Connection Status: 753985
Implantable Lead Implant Date: 20120120
Implantable Lead Implant Date: 20120120
Implantable Lead Location: 753859
Implantable Lead Location: 753860
Implantable Pulse Generator Implant Date: 20230509
Lead Channel Impedance Value: 437 Ohm
Lead Channel Impedance Value: 551 Ohm
Lead Channel Pacing Threshold Amplitude: 0.75 V
Lead Channel Pacing Threshold Pulse Width: 0.4 ms
Lead Channel Setting Pacing Amplitude: 2 V
Lead Channel Setting Pacing Pulse Width: 0.4 ms
Lead Channel Setting Sensing Sensitivity: 4 mV
Zone Setting Status: 755011

## 2022-07-31 ENCOUNTER — Ambulatory Visit: Payer: Medicare Other | Attending: Cardiology

## 2022-07-31 DIAGNOSIS — Z5181 Encounter for therapeutic drug level monitoring: Secondary | ICD-10-CM | POA: Diagnosis not present

## 2022-07-31 DIAGNOSIS — Z952 Presence of prosthetic heart valve: Secondary | ICD-10-CM | POA: Diagnosis not present

## 2022-07-31 DIAGNOSIS — I4891 Unspecified atrial fibrillation: Secondary | ICD-10-CM

## 2022-07-31 LAB — POCT INR: INR: 3.9 — AB (ref 2.0–3.0)

## 2022-07-31 NOTE — Patient Instructions (Signed)
Description   Skip today's dosage of Warfarin, then resume same dosage of Warfarin 1 tablet daily except for 1.5 tablets on Tuesday, Thursday and Saturday.  Recheck INR in 3 weeks.  Coumadin Clinic [phone] Keep your greens intake consistent.

## 2022-08-15 NOTE — Progress Notes (Signed)
Remote pacemaker transmission.   

## 2022-08-21 ENCOUNTER — Ambulatory Visit: Payer: Medicare Other | Attending: Cardiovascular Disease | Admitting: *Deleted

## 2022-08-21 DIAGNOSIS — Z952 Presence of prosthetic heart valve: Secondary | ICD-10-CM | POA: Diagnosis not present

## 2022-08-21 DIAGNOSIS — Z5181 Encounter for therapeutic drug level monitoring: Secondary | ICD-10-CM | POA: Diagnosis not present

## 2022-08-21 DIAGNOSIS — I4891 Unspecified atrial fibrillation: Secondary | ICD-10-CM | POA: Diagnosis not present

## 2022-08-21 LAB — POCT INR: POC INR: 8

## 2022-08-21 LAB — PROTIME-INR
INR: 9.3 (ref 0.9–1.2)
Prothrombin Time: 85.2 s — ABNORMAL HIGH (ref 9.1–12.0)

## 2022-08-21 NOTE — Patient Instructions (Addendum)
Description   Called and spoke to pt and husband instructed her to: Hold warfarin 3/4, 3/5, 3/6 and 3/7. Then START taking warfarin 1 tablet daily except for 1.5 tablets on Tuesday and Saturday. Recheck INR in 1 week.

## 2022-08-28 ENCOUNTER — Ambulatory Visit: Payer: Medicare Other | Attending: Internal Medicine | Admitting: *Deleted

## 2022-08-28 DIAGNOSIS — Z5181 Encounter for therapeutic drug level monitoring: Secondary | ICD-10-CM

## 2022-08-28 DIAGNOSIS — Z952 Presence of prosthetic heart valve: Secondary | ICD-10-CM

## 2022-08-28 DIAGNOSIS — I4891 Unspecified atrial fibrillation: Secondary | ICD-10-CM | POA: Diagnosis not present

## 2022-08-28 LAB — POCT INR: POC INR: 1.5

## 2022-08-28 NOTE — Patient Instructions (Signed)
Description    Take 1.5 tablets of warfarin today and then START taking warfarin 1 tablet daily except for 1.5 tablets on Tuesday and Saturday. Recheck INR in 1 week.

## 2022-09-04 ENCOUNTER — Ambulatory Visit: Payer: Medicare Other | Attending: Internal Medicine | Admitting: Pharmacist

## 2022-09-04 DIAGNOSIS — I639 Cerebral infarction, unspecified: Secondary | ICD-10-CM

## 2022-09-04 DIAGNOSIS — Z5181 Encounter for therapeutic drug level monitoring: Secondary | ICD-10-CM | POA: Diagnosis not present

## 2022-09-04 DIAGNOSIS — I4891 Unspecified atrial fibrillation: Secondary | ICD-10-CM | POA: Diagnosis not present

## 2022-09-04 DIAGNOSIS — Z952 Presence of prosthetic heart valve: Secondary | ICD-10-CM

## 2022-09-04 LAB — POCT INR: INR: 2 (ref 2.0–3.0)

## 2022-09-04 NOTE — Patient Instructions (Signed)
Description    Take 2 tablets of warfarin today and then START taking warfarin 1 tablet daily except for 1.5 tablets on Tuesday and Saturday. Recheck INR in 1 week.

## 2022-09-11 ENCOUNTER — Ambulatory Visit: Payer: Medicare Other

## 2022-09-12 ENCOUNTER — Ambulatory Visit: Payer: Medicare Other | Attending: Cardiology | Admitting: *Deleted

## 2022-09-12 DIAGNOSIS — Z952 Presence of prosthetic heart valve: Secondary | ICD-10-CM | POA: Diagnosis not present

## 2022-09-12 DIAGNOSIS — Z5181 Encounter for therapeutic drug level monitoring: Secondary | ICD-10-CM | POA: Diagnosis not present

## 2022-09-12 DIAGNOSIS — I4891 Unspecified atrial fibrillation: Secondary | ICD-10-CM | POA: Diagnosis not present

## 2022-09-12 LAB — POCT INR: POC INR: 3.8

## 2022-09-12 NOTE — Patient Instructions (Addendum)
Description   Hold warfarin tomorrow then continue taking warfarin 1 tablet daily except for 1.5 tablets on Tuesday and Saturday. Recheck INR in 1 week.

## 2022-09-21 ENCOUNTER — Ambulatory Visit: Payer: Medicare Other | Attending: Internal Medicine | Admitting: *Deleted

## 2022-09-21 DIAGNOSIS — I4891 Unspecified atrial fibrillation: Secondary | ICD-10-CM | POA: Diagnosis not present

## 2022-09-21 DIAGNOSIS — Z5181 Encounter for therapeutic drug level monitoring: Secondary | ICD-10-CM

## 2022-09-21 DIAGNOSIS — Z952 Presence of prosthetic heart valve: Secondary | ICD-10-CM

## 2022-09-21 LAB — POCT INR: INR: 3.2 — AB (ref 2.0–3.0)

## 2022-09-21 NOTE — Patient Instructions (Signed)
Description   Continue taking warfarin 1 tablet daily except for 1.5 tablets on Tuesday and Saturday. Recheck INR in 2 weeks. Anticoagulation Clinic [phone] or [phone]

## 2022-10-09 ENCOUNTER — Ambulatory Visit: Payer: Medicare Other | Attending: Internal Medicine | Admitting: Pharmacist

## 2022-10-09 DIAGNOSIS — Z952 Presence of prosthetic heart valve: Secondary | ICD-10-CM

## 2022-10-09 DIAGNOSIS — I639 Cerebral infarction, unspecified: Secondary | ICD-10-CM | POA: Diagnosis not present

## 2022-10-09 DIAGNOSIS — Z5181 Encounter for therapeutic drug level monitoring: Secondary | ICD-10-CM

## 2022-10-09 DIAGNOSIS — I4891 Unspecified atrial fibrillation: Secondary | ICD-10-CM | POA: Diagnosis not present

## 2022-10-09 LAB — POCT INR: INR: 2.8 (ref 2.0–3.0)

## 2022-10-09 NOTE — Patient Instructions (Signed)
Description   Continue taking warfarin 1 tablet daily except for 1.5 tablets on Tuesday and Saturday. Recheck INR in 4 weeks. Anticoagulation Clinic [phone] or [phone]

## 2022-10-24 ENCOUNTER — Other Ambulatory Visit: Payer: Self-pay | Admitting: Internal Medicine

## 2022-10-24 DIAGNOSIS — I4891 Unspecified atrial fibrillation: Secondary | ICD-10-CM

## 2022-10-24 DIAGNOSIS — Z5181 Encounter for therapeutic drug level monitoring: Secondary | ICD-10-CM

## 2022-10-24 DIAGNOSIS — Z952 Presence of prosthetic heart valve: Secondary | ICD-10-CM

## 2022-10-24 NOTE — Telephone Encounter (Addendum)
Warfarin 7.5mg  refill Afib, CVA, Mitral valve replaced Last INR 10/09/22 Last OV 02/06/22

## 2022-10-27 ENCOUNTER — Ambulatory Visit (INDEPENDENT_AMBULATORY_CARE_PROVIDER_SITE_OTHER): Payer: Medicare Other

## 2022-10-27 DIAGNOSIS — I442 Atrioventricular block, complete: Secondary | ICD-10-CM | POA: Diagnosis not present

## 2022-10-27 LAB — CUP PACEART REMOTE DEVICE CHECK
Battery Remaining Longevity: 151 mo
Battery Voltage: 3.09 V
Brady Statistic RV Percent Paced: 99.91 %
Date Time Interrogation Session: 20240509191004
Implantable Lead Connection Status: 753985
Implantable Lead Connection Status: 753985
Implantable Lead Implant Date: 20120120
Implantable Lead Implant Date: 20120120
Implantable Lead Location: 753859
Implantable Lead Location: 753860
Implantable Pulse Generator Implant Date: 20230509
Lead Channel Impedance Value: 418 Ohm
Lead Channel Impedance Value: 532 Ohm
Lead Channel Pacing Threshold Amplitude: 0.75 V
Lead Channel Pacing Threshold Pulse Width: 0.4 ms
Lead Channel Setting Pacing Amplitude: 2 V
Lead Channel Setting Pacing Pulse Width: 0.4 ms
Lead Channel Setting Sensing Sensitivity: 4 mV
Zone Setting Status: 755011

## 2022-11-06 ENCOUNTER — Ambulatory Visit: Payer: Medicare Other | Attending: Cardiovascular Disease

## 2022-11-06 DIAGNOSIS — Z5181 Encounter for therapeutic drug level monitoring: Secondary | ICD-10-CM

## 2022-11-06 DIAGNOSIS — Z952 Presence of prosthetic heart valve: Secondary | ICD-10-CM | POA: Diagnosis not present

## 2022-11-06 DIAGNOSIS — I4891 Unspecified atrial fibrillation: Secondary | ICD-10-CM | POA: Diagnosis not present

## 2022-11-06 LAB — POCT INR: INR: 2.4 (ref 2.0–3.0)

## 2022-11-06 NOTE — Patient Instructions (Signed)
TAKE 1.5 TABLETS TODAY THEN Continue taking warfarin 1 tablet daily except for 1.5 tablets on Tuesday and Saturday. Recheck INR in 4 weeks. Anticoagulation Clinic [phone] or [phone]

## 2022-11-14 NOTE — Progress Notes (Signed)
Remote pacemaker transmission.   

## 2022-12-05 NOTE — Progress Notes (Unsigned)
Cardiology Office Note Date:  12/06/2022  Patient ID:  Pamela Walker, Pamela Walker, MRN [id number] PCP:  Randel Pigg, Dorma Russell, MD  Cardiologist:  Electrophysiologist: Dr. Ladona Ridgel    Chief Complaint:  annual visit (pt requested earlier visit)  History of Present Illness: [name] is a 77 y.o. 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 Ladona Ridgel 02/06/22, doing well, device dependent, permanently in Afib, no changes were made.   TODAY She declines IT trainer, speaks fair English and prefers her husband to help with her visit translation.  She feels well Wanted to come in sooner, to find out hiw to get a new ID card for her PPM, and read in a paper/report that there was some kind of mesh placed at the time of her procedure./gen change and wanted to review that. Dr. Ladona Ridgel did utilize an antibiotic pouch, this I think is what I think she is referring to, and we discussed that.  She denies any CP, palpitations No SOB, DOE She walks around the house for exercises and denies any difficulties with her ADLs No near syncope or syncope. She will get occ bruises on her arms, but no bleeding or signs of bleeding otherwise INRs followed by our clinic  Her PMD does her labs otherwise     Device information ELA/sorin (?) dual chamber PPM implanted 11/18/1982 (in [place]), gen change 07/08/10 , gen change 10/25/21 is a MDT device  CXR reviewed, he has been somewhere along the line >> single chamber PPM she  appears to have 2 ABANDONED RV leads and an ABANDONED RA lead That I do not have complete data on RA [employer] [id number] RV      "          "         "        PY-52-PBV (is the current RV lead in use   Past Medical History:  Diagnosis Date   Atrial fibrillation (HCC)     on coumadin   DM (diabetes mellitus) (HCC)    Hyperlipidemia    Rheumatic fever/heart disease    Seizure (HCC)    Stroke (HCC) 2017   [facility]    Past Surgical History:  Procedure Laterality Date   APPENDECTOMY     CESAREAN SECTION     INSERT / REPLACE / REMOVE PACEMAKER     MITRAL VALVE REPLACEMENT     PPM GENERATOR CHANGEOUT N/A 10/25/2021   Procedure: PPM GENERATOR CHANGEOUT;  Surgeon: Marinus Maw, MD;  Location: [facility];  Service: Cardiovascular;  Laterality: N/A;    Current Outpatient Medications  Medication Sig Dispense Refill   amoxicillin (AMOXIL) 500 MG tablet Take 4 tablets (2 g) 30-60 minutes prior to dental appointment/prodedure 4 tablet 3   Ascorbic Acid (VITAMIN C PO) Take 1 tablet by mouth daily.     atorvastatin (LIPITOR) 40 MG tablet Take 40 mg by mouth daily.     carvedilol (COREG) 6.25 MG tablet Take 1 tablet (6.25 mg total) by mouth 2 (two) times daily. 180 tablet 3   cetirizine (ZYRTEC) 10 MG tablet Take 10 mg by mouth daily.     escitalopram (LEXAPRO) 5 MG tablet Take 1 tablet by mouth daily.     folic acid (FOLVITE)  1 MG tablet Take 1 mg by mouth 4 (four) times a week.     lisinopril (PRINIVIL,ZESTRIL) 5 MG tablet Take 1 tablet (5 mg total) by mouth daily. 90 tablet 2   metFORMIN (GLUCOPHAGE-XR) 500 MG 24 hr tablet Take 1 tablet by mouth daily.     spironolactone (ALDACTONE) 25 MG tablet Take 25 mg by mouth every Monday, Wednesday, and Friday.     Vitamin D3 (VITAMIN D) 25 MCG tablet Take 1,000 Units by mouth daily.     warfarin (COUMADIN) 7.5 MG tablet TOME 1 A 1 Y 1/2 TABLETAS POR LA BOCA CADA DIA COMO SE INDICO  POR LA CLINICA DE COUMADIN 120 tablet 1   ezetimibe (ZETIA) 10 MG tablet Take 10 mg by mouth daily.     levETIRAcetam (KEPPRA) 500 MG tablet Take 1 tablet (500 mg total) by mouth 2 (two) times daily. 60 tablet 0   No current facility-administered medications for this visit.    Allergies:   Patient has no known allergies.   Social  History:  The patient  reports that she quit smoking about 44 years ago. Her smoking use included cigarettes. She has a 20.00 pack-year smoking history. She has never used smokeless tobacco. She reports that she does not drink alcohol and does not use drugs.   Family History:  The patient's family history includes Arrhythmia in her sister; Heart disease in her mother.  ROS:  Please see the history of present illness.    All other systems are reviewed and otherwise negative.   PHYSICAL EXAM:  VS:  BP 120/68   Pulse 71   Ht 5\' 3"  (1.6 m)   Wt 133 lb 9.6 oz (60.6 kg)   SpO2 94%   BMI 23.67 kg/m  BMI: Body mass index is 23.67 kg/m. Well nourished, well developed, in no acute distress HEENT: normocephalic, atraumatic Neck: no JVD, carotid bruits or masses Cardiac:  RRR (paced); valve is appreciated, no rubs, or gallops Lungs:  CTA b/l, no wheezing, rhonchi or rales Abd: soft, nontender MS: no deformity or atrophy Ext: no edema Skin: warm and dry, no rash Neuro:  No gross deficits appreciated Psych: euthymic mood, full affect  PPM site is stable, no tethering or discomfort   EKG:  Done today and reviewed by myself shows  V paced 71bpm  Device interrogation done today and reviewed by myself:  Battery and lead measurements are good 100 %VP Dependent  03/13/2016: TTE Summary  Ejection fraction is visually estimated at 60%  Normal left ventricular size and systolic function with no appreciable  segmental abnormality.  Abnormal septal motion due to paced rhythm or BBB.  Normally functioning prosthetic mitral valve.  Normal size left atrium.  No obvious or large vegetations seen but consider TEE if clinically indicated.  Signature    01/24/2016 TTE LVEF 45-50%, calculated 45% Bioprosthetic MVR, trace MR  Recent Labs: No results found for requested labs within last 365 days.  No results found for requested labs within last 365 days.   CrCl cannot be calculated (Patient's  most recent lab result is older than the maximum 21 days allowed.).   Wt Readings from Last 3 Encounters:  12/06/22 133 lb 9.6 oz (60.6 kg)  02/06/22 128 lb 9.6 oz (58.3 kg)  10/25/21 135 lb (61.2 kg)     Other studies reviewed: Additional studies/records reviewed today include: summarized above  ASSESSMENT AND PLAN:  PPM Intact function No programming changes made  Permanent AFib CHA2DS2Vasc is 6,  on warfarin Rate controlled/pacer dependent   VHD w/MVR (mechanical) Has been some years for an echo Will update   Secondary hypercoagulable state   Hx of Mild CM Better on her last echo No symptoms or exam findings of volume OL    Disposition: F/u with remotes as usual and in clinic again in a year, sooner if needed, pending her echo findings     Current medicines are reviewed at length with the patient today.  The patient did not have any concerns regarding medicines.  Norma Fredrickson, PA-C 12/06/2022 2:22 PM     [facility] [address] [place] [address] [phone] (office)  [phone] (fax)

## 2022-12-06 ENCOUNTER — Ambulatory Visit: Payer: Medicare Other

## 2022-12-06 ENCOUNTER — Encounter: Payer: Self-pay | Admitting: Physician Assistant

## 2022-12-06 ENCOUNTER — Ambulatory Visit: Payer: Medicare Other | Attending: Physician Assistant | Admitting: Physician Assistant

## 2022-12-06 VITALS — BP 120/68 | HR 71 | Ht 63.0 in | Wt 133.6 lb

## 2022-12-06 DIAGNOSIS — I4891 Unspecified atrial fibrillation: Secondary | ICD-10-CM

## 2022-12-06 DIAGNOSIS — Z952 Presence of prosthetic heart valve: Secondary | ICD-10-CM

## 2022-12-06 DIAGNOSIS — I428 Other cardiomyopathies: Secondary | ICD-10-CM | POA: Diagnosis not present

## 2022-12-06 DIAGNOSIS — Z5181 Encounter for therapeutic drug level monitoring: Secondary | ICD-10-CM | POA: Diagnosis not present

## 2022-12-06 DIAGNOSIS — Z950 Presence of cardiac pacemaker: Secondary | ICD-10-CM | POA: Diagnosis not present

## 2022-12-06 DIAGNOSIS — D6869 Other thrombophilia: Secondary | ICD-10-CM

## 2022-12-06 DIAGNOSIS — I4821 Permanent atrial fibrillation: Secondary | ICD-10-CM

## 2022-12-06 LAB — CUP PACEART INCLINIC DEVICE CHECK
Battery Remaining Longevity: 149 mo
Battery Voltage: 3.07 V
Brady Statistic RV Percent Paced: 99.96 %
Date Time Interrogation Session: 20240619180712
Implantable Lead Connection Status: 753985
Implantable Lead Connection Status: 753985
Implantable Lead Implant Date: 20120120
Implantable Lead Implant Date: 20120120
Implantable Lead Location: 753859
Implantable Lead Location: 753860
Implantable Pulse Generator Implant Date: 20230509
Lead Channel Impedance Value: 399 Ohm
Lead Channel Impedance Value: 532 Ohm
Lead Channel Pacing Threshold Amplitude: 0.625 V
Lead Channel Pacing Threshold Pulse Width: 0.4 ms
Lead Channel Setting Pacing Amplitude: 2 V
Lead Channel Setting Pacing Pulse Width: 0.4 ms
Lead Channel Setting Sensing Sensitivity: 4 mV
Zone Setting Status: 755011

## 2022-12-06 LAB — POCT INR: INR: 4.5 — AB (ref 2.0–3.0)

## 2022-12-06 NOTE — Patient Instructions (Addendum)
Medication Instructions:   Your physician recommends that you continue on your current medications as directed. Please refer to the Current Medication list given to you today.    *If you need a refill on your cardiac medications before your next appointment, please call your pharmacy*   Lab Work:  NONE ORDERED  TODAY   If you have labs (blood work) drawn today and your tests are completely normal, you will receive your results only by: MyChart Message (if you have MyChart) OR A paper copy in the mail If you have any lab test that is abnormal or we need to change your treatment, we will call you to review the results.   Testing/Procedures: Your physician has requested that you have an echocardiogram. Echocardiography is a painless test that uses sound waves to create images of your heart. It provides your doctor with information about the size and shape of your heart and how well your heart's chambers and valves are working. This procedure takes approximately one hour. There are no restrictions for this procedure. Please do NOT wear cologne, perfume, aftershave, or lotions (deodorant is allowed). Please arrive 15 minutes prior to your appointment time.    Follow-Up: At [facility], you and your health needs are our priority.  As part of our continuing mission to provide you with exceptional heart care, we have created designated Provider Care Teams.  These Care Teams include your primary Cardiologist (physician) and Advanced Practice Providers (APPs -  Physician Assistants and Nurse Practitioners) who all work together to provide you with the care you need, when you need it.  We recommend signing up for the patient portal called "MyChart".  Sign up information is provided on this After Visit Summary.  MyChart is used to connect with patients for Virtual Visits (Telemedicine).  Patients are able to view lab/test results, encounter notes, upcoming appointments, etc.  Non-urgent  messages can be sent to your provider as well.   To learn more about what you can do with MyChart, go to [email or web address].    Your next appointment:   1 year(s)  Provider:   [name], MD    Other Instructions

## 2022-12-06 NOTE — Patient Instructions (Signed)
Description   Eat a serving of greens and HOLD tomorrow's dose and then continue taking warfarin 1 tablet daily except for 1.5 tablets on Tuesday and Saturday.  Recheck INR in 2 weeks.  Anticoagulation Clinic [phone] or [phone]

## 2022-12-18 ENCOUNTER — Ambulatory Visit: Payer: Medicare Other | Attending: Cardiology

## 2022-12-18 DIAGNOSIS — I4891 Unspecified atrial fibrillation: Secondary | ICD-10-CM

## 2022-12-18 DIAGNOSIS — Z952 Presence of prosthetic heart valve: Secondary | ICD-10-CM | POA: Diagnosis not present

## 2022-12-18 DIAGNOSIS — Z5181 Encounter for therapeutic drug level monitoring: Secondary | ICD-10-CM | POA: Diagnosis not present

## 2022-12-18 LAB — POCT INR: INR: 4.3 — AB (ref 2.0–3.0)

## 2022-12-18 NOTE — Patient Instructions (Signed)
HOLD dose and then decrease to 1 tablet daily except for 1.5 tablets on Tuesday.  Recheck INR in 2 weeks.  Anticoagulation Clinic [phone] or [phone]

## 2023-01-01 ENCOUNTER — Ambulatory Visit: Payer: Medicare Other | Attending: Internal Medicine

## 2023-01-01 DIAGNOSIS — Z952 Presence of prosthetic heart valve: Secondary | ICD-10-CM | POA: Diagnosis not present

## 2023-01-01 DIAGNOSIS — Z5181 Encounter for therapeutic drug level monitoring: Secondary | ICD-10-CM

## 2023-01-01 DIAGNOSIS — I4891 Unspecified atrial fibrillation: Secondary | ICD-10-CM

## 2023-01-01 LAB — POCT INR: INR: 2.7 (ref 2.0–3.0)

## 2023-01-01 NOTE — Patient Instructions (Signed)
Continue 1 tablet daily except for 1.5 tablets on Tuesday.  Recheck INR in 4 weeks.  Anticoagulation Clinic [phone] or [phone]

## 2023-01-06 ENCOUNTER — Other Ambulatory Visit: Payer: Self-pay | Admitting: Internal Medicine

## 2023-01-06 DIAGNOSIS — I4891 Unspecified atrial fibrillation: Secondary | ICD-10-CM

## 2023-01-06 DIAGNOSIS — Z5181 Encounter for therapeutic drug level monitoring: Secondary | ICD-10-CM

## 2023-01-06 DIAGNOSIS — Z952 Presence of prosthetic heart valve: Secondary | ICD-10-CM

## 2023-01-22 ENCOUNTER — Encounter (HOSPITAL_COMMUNITY): Payer: Self-pay | Admitting: Physician Assistant

## 2023-01-22 ENCOUNTER — Ambulatory Visit (HOSPITAL_COMMUNITY): Payer: Medicare Other | Attending: Physician Assistant

## 2023-01-26 ENCOUNTER — Ambulatory Visit (INDEPENDENT_AMBULATORY_CARE_PROVIDER_SITE_OTHER): Payer: Medicare Other

## 2023-01-26 DIAGNOSIS — I442 Atrioventricular block, complete: Secondary | ICD-10-CM | POA: Diagnosis not present

## 2023-01-29 ENCOUNTER — Ambulatory Visit: Payer: Medicare Other | Attending: Cardiology

## 2023-01-29 DIAGNOSIS — Z952 Presence of prosthetic heart valve: Secondary | ICD-10-CM

## 2023-01-29 DIAGNOSIS — Z5181 Encounter for therapeutic drug level monitoring: Secondary | ICD-10-CM | POA: Diagnosis not present

## 2023-01-29 DIAGNOSIS — I4891 Unspecified atrial fibrillation: Secondary | ICD-10-CM | POA: Diagnosis not present

## 2023-01-29 LAB — POCT INR: INR: 3.6 — AB (ref 2.0–3.0)

## 2023-01-29 NOTE — Patient Instructions (Signed)
Continue 1 tablet daily except for 1.5 tablets on Tuesday.  Recheck INR in 6 weeks.  Anticoagulation Clinic [phone] or [phone]

## 2023-02-09 NOTE — Progress Notes (Signed)
Remote pacemaker transmission.   

## 2023-02-26 ENCOUNTER — Telehealth: Payer: Self-pay | Admitting: Internal Medicine

## 2023-02-26 NOTE — Telephone Encounter (Signed)
New patient   Patient walked in wanting to schedule a appt with Dr Ladona Ridgel only.  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  Dr Ladona Ridgel did not have anything in sept or oct.  Please call patient to triage her and see if she needs general cardiologist or Dr Ladona Ridgel only, etc.

## 2023-02-26 NOTE — Telephone Encounter (Signed)
Using interpreter services, I called both available numbers and LM on answering machine to discuss further with patient.  Patient given device clinic number to call back.

## 2023-02-27 NOTE — Telephone Encounter (Signed)
Left detailed message requesting call back

## 2023-02-28 NOTE — Telephone Encounter (Signed)
Using interpreter services attempted outreach again to patient.  Interpreter LM on VM in spanish to please have patient call back as we are following up from her walk in the other day.

## 2023-03-05 ENCOUNTER — Telehealth: Payer: Self-pay

## 2023-03-05 NOTE — Telephone Encounter (Signed)
Patient walked in today with husband who acts as her translator.  She was wanting to make an appointment with Dr. Ladona Ridgel to follow up on her symptoms.    I was able to bring patient back to device room and triage her symptoms.  She denies any symptoms at present but does report having intermittent SOB and pains to chest and down her leg when exerting herself.  Denies edema, no obvious s/s of fluid retention. This has progressed over the past month.  In addition, husband states she has fallen twice during these events. They are concerned it could have something to do with her pacemaker.  Patient has history of cardiomyopathy, CHB, CVA/  persistent AF, MVR history (rheumatic heart disease), DM.   Device interrogated today.  Normal device function.  There were no episodes.  HG's were slightly blunted but given concerns for possible ischemic complications that need to be ruled out, I do not think it wise to make rate response adjustments today. I do note that patient does VP 99% and I do not see a recent ECHO on record, appears one was ordered in June 2024 but not obtained.  Given her symptoms and no concern for device correlation, patient should follow up for a general cardiologist review.  Dr. Ladona Ridgel is listed as her gen card and her EP provider.   Appointment made with Dr. Ladona Ridgel for tomorrow 03/06/23 for 1015am.  ER precautions were given in the meantime.

## 2023-03-06 ENCOUNTER — Encounter: Payer: Self-pay | Admitting: Internal Medicine

## 2023-03-06 ENCOUNTER — Ambulatory Visit: Payer: Medicare Other | Attending: Internal Medicine | Admitting: Internal Medicine

## 2023-03-06 VITALS — BP 122/76 | HR 85 | Ht 63.0 in | Wt 137.0 lb

## 2023-03-06 DIAGNOSIS — I4891 Unspecified atrial fibrillation: Secondary | ICD-10-CM | POA: Diagnosis not present

## 2023-03-06 NOTE — Patient Instructions (Signed)
Medication Instructions:  Your physician recommends that you continue on your current medications as directed. Please refer to the Current Medication list given to you today.  *If you need a refill on your cardiac medications before your next appointment, please call your pharmacy*  Lab Work: None ordered.  If you have labs (blood work) drawn today and your tests are completely normal, you will receive your results only by: MyChart Message (if you have MyChart) OR A paper copy in the mail If you have any lab test that is abnormal or we need to change your treatment, we will call you to review the results.  Testing/Procedures: None ordered.  Follow-Up: At [facility], you and your health needs are our priority.  As part of our continuing mission to provide you with exceptional heart care, we have created designated Provider Care Teams.  These Care Teams include your primary Cardiologist (physician) and Advanced Practice Providers (APPs -  Physician Assistants and Nurse Practitioners) who all work together to provide you with the care you need, when you need it.  Your next appointment:   1 year(s)  The format for your next appointment:   In Person  Provider:   [name], MD{or one of the following Advanced Practice Providers on your designated Care Team:   Francis Dowse, [place] Casimiro Needle "Mardelle Matte" [place], [place] Earnest Rosier, NP   Important Information About Sugar

## 2023-03-06 NOTE — Progress Notes (Signed)
HPI Pamela Walker returns today for followup. She is a pleasant 77 yo woman with MV replacement, CHB, s/p PPM insertion. She has done well in the interim. She denies chest pain or sob. She remains on chronic coumadin therapy. She has undergone PM gen change out and is doing well. She has chronic atrial fib. She notes that she has done well in the interim.   No Known Allergies   Current Outpatient Medications  Medication Sig Dispense Refill   amoxicillin (AMOXIL) 500 MG tablet Take 4 tablets (2 g) 30-60 minutes prior to dental appointment/prodedure 4 tablet 3   Ascorbic Acid (VITAMIN C PO) Take 1 tablet by mouth daily.     atorvastatin (LIPITOR) 40 MG tablet Take 40 mg by mouth daily.     carvedilol (COREG) 6.25 MG tablet Take 1 tablet (6.25 mg total) by mouth 2 (two) times daily. 180 tablet 3   cetirizine (ZYRTEC) 10 MG tablet Take 10 mg by mouth daily.     escitalopram (LEXAPRO) 5 MG tablet Take 1 tablet by mouth daily.     ezetimibe (ZETIA) 10 MG tablet Take 10 mg by mouth daily.     folic acid (FOLVITE) 1 MG tablet Take 1 mg by mouth 4 (four) times a week.     lisinopril (PRINIVIL,ZESTRIL) 5 MG tablet Take 1 tablet (5 mg total) by mouth daily. 90 tablet 2   metFORMIN (GLUCOPHAGE-XR) 500 MG 24 hr tablet Take 1 tablet by mouth daily.     spironolactone (ALDACTONE) 25 MG tablet Take 25 mg by mouth every Monday, Wednesday, and Friday.     Vitamin D3 (VITAMIN D) 25 MCG tablet Take 1,000 Units by mouth daily.     warfarin (COUMADIN) 7.5 MG tablet TOME 1 A 1 Y 1/2 TABLETAS POR LA BOCA CADA DIA COMO SE INDICO  POR LA CLINICA DE COUMADIN 150 tablet 2   levETIRAcetam (KEPPRA) 500 MG tablet Take 1 tablet (500 mg total) by mouth 2 (two) times daily. 60 tablet 0   No current facility-administered medications for this visit.     Past Medical History:  Diagnosis Date   Atrial fibrillation (HCC)    on coumadin   DM (diabetes mellitus) (HCC)    Hyperlipidemia    Rheumatic fever/heart  disease    Seizure (HCC)    Stroke (HCC) 2017   ICH    ROS:   All systems reviewed and negative except as noted in the HPI.   Past Surgical History:  Procedure Laterality Date   APPENDECTOMY     CESAREAN SECTION     INSERT / REPLACE / REMOVE PACEMAKER     MITRAL VALVE REPLACEMENT     PPM GENERATOR CHANGEOUT N/A 10/25/2021   Procedure: PPM GENERATOR CHANGEOUT;  Surgeon: Marinus Maw, MD;  Location: [facility];  Service: Cardiovascular;  Laterality: N/A;     Family History  Problem Relation Age of Onset   Heart disease Mother        angina   Arrhythmia Sister        Pacer     Social History   Socioeconomic History   Marital status: Married    Spouse name: Not on file   Number of children: Not on file   Years of education: Not on file   Highest education level: Not on file  Occupational History   Not on file  Tobacco Use   Smoking status: Former    Current packs/day: 0.00  Average packs/day: 2.0 packs/day for 10.0 years (20.0 ttl pk-yrs)    Types: Cigarettes    Start date: 74    Quit date: 83    Years since quitting: 44.7   Smokeless tobacco: Never  Substance and Sexual Activity   Alcohol use: No    Alcohol/week: 0.0 standard drinks of alcohol    Comment: 1 glass of wine every few months.   Drug use: No   Sexual activity: Not on file  Other Topics Concern   Not on file  Social History Narrative   Not on file   Social Determinants of Health   Financial Resource Strain: At Risk (07/24/2022)   Received from [place], [place]   [employer]    Financial Resource Strain: 2  Food Insecurity: Not at Risk (07/24/2022)   Received from [place], [place]   Food Insecurity    Food: 1  Transportation Needs: Not at Risk (07/24/2022)   Received from [place], [employer] Needs    Transportation: 1  Physical Activity: Not on File (07/05/2022)   Received from [place], [place]   Physical Activity    Physical Activity: 0  Stress: Not on File  (07/05/2022)   Received from [place], [place]   Stress    Stress: 0  Social Connections: Not on File (02/26/2023)   Received from [employer]   Social Connections    Connectedness: 0  Intimate Partner Violence: Not on file     BP 122/76   Pulse 85   Ht 5\' 3"  (1.6 m)   Wt 137 lb (62.1 kg)   SpO2 93%   BMI 24.27 kg/m   Physical Exam:  Well appearing NAD HEENT: Unremarkable Neck:  No JVD, no thyromegally Lymphatics:  No adenopathy Back:  No CVA tenderness Lungs:  Clear with no wheezes HEART:  Regular rate rhythm, no murmurs, no rubs, no clicks Abd:  soft, positive bowel sounds, no organomegally, no rebound, no guarding Ext:  2 plus pulses, no edema, no cyanosis, no clubbing Skin:  No rashes no nodules Neuro:  CN II through XII intact, motor grossly intact  EKG - nsr with ventricular pacing  DEVICE  Normal device function.  See PaceArt for details.   Assess/Plan:  1. CHB - she is asymptomatic, s/p PPM. No escape at 30. She has undergone PM gen change out.  2. Atrial fib - she is chronically in atrial fib. Her rates are well controlled. 3. MV replacement - her valve on exam appears to be working normally.  4. coags - she has had no bleeding on coumadin. She gets her INR checked in our coumadin clinic.   Sharlot Gowda Langston [place],MD

## 2023-03-12 ENCOUNTER — Ambulatory Visit: Payer: Medicare Other | Attending: Cardiology

## 2023-03-12 DIAGNOSIS — Z952 Presence of prosthetic heart valve: Secondary | ICD-10-CM | POA: Diagnosis not present

## 2023-03-12 DIAGNOSIS — Z5181 Encounter for therapeutic drug level monitoring: Secondary | ICD-10-CM | POA: Diagnosis not present

## 2023-03-12 DIAGNOSIS — I4891 Unspecified atrial fibrillation: Secondary | ICD-10-CM | POA: Diagnosis not present

## 2023-03-12 LAB — POCT INR: INR: 1.9 — AB (ref 2.0–3.0)

## 2023-03-12 NOTE — Patient Instructions (Signed)
Description   Take 1.5 tablets today and 2 tablets tomorrow and then continue 1 tablet daily except for 1.5 tablets on Tuesday.  Recheck INR in 3 weeks.  Anticoagulation Clinic [phone] or [phone]

## 2023-04-02 ENCOUNTER — Ambulatory Visit: Payer: Medicare Other | Attending: Cardiology | Admitting: *Deleted

## 2023-04-02 DIAGNOSIS — Z952 Presence of prosthetic heart valve: Secondary | ICD-10-CM

## 2023-04-02 DIAGNOSIS — I4891 Unspecified atrial fibrillation: Secondary | ICD-10-CM

## 2023-04-02 DIAGNOSIS — Z5181 Encounter for therapeutic drug level monitoring: Secondary | ICD-10-CM | POA: Diagnosis not present

## 2023-04-02 DIAGNOSIS — I639 Cerebral infarction, unspecified: Secondary | ICD-10-CM | POA: Diagnosis not present

## 2023-04-02 LAB — POCT INR: INR: 2.7 (ref 2.0–3.0)

## 2023-04-02 NOTE — Patient Instructions (Signed)
Description   Continue taking warfarin 1 tablet daily except for 1.5 tablets on Tuesday.  Recheck INR in 4 weeks.  Anticoagulation Clinic [phone] or [phone]

## 2023-04-27 ENCOUNTER — Ambulatory Visit (INDEPENDENT_AMBULATORY_CARE_PROVIDER_SITE_OTHER): Payer: Medicare Other

## 2023-04-27 DIAGNOSIS — I442 Atrioventricular block, complete: Secondary | ICD-10-CM | POA: Diagnosis not present

## 2023-04-30 LAB — CUP PACEART REMOTE DEVICE CHECK
Battery Remaining Longevity: 143 mo
Battery Voltage: 3.05 V
Brady Statistic RV Percent Paced: 99.87 %
Date Time Interrogation Session: 20241108025645
Implantable Lead Connection Status: 753985
Implantable Lead Connection Status: 753985
Implantable Lead Implant Date: 20120120
Implantable Lead Implant Date: 20120120
Implantable Lead Location: 753859
Implantable Lead Location: 753860
Implantable Pulse Generator Implant Date: 20230509
Lead Channel Impedance Value: 380 Ohm
Lead Channel Impedance Value: 494 Ohm
Lead Channel Pacing Threshold Amplitude: 0.75 V
Lead Channel Pacing Threshold Pulse Width: 0.4 ms
Lead Channel Setting Pacing Amplitude: 2 V
Lead Channel Setting Pacing Pulse Width: 0.4 ms
Lead Channel Setting Sensing Sensitivity: 4 mV
Zone Setting Status: 755011

## 2023-05-02 ENCOUNTER — Ambulatory Visit: Payer: Medicare Other | Attending: Internal Medicine | Admitting: *Deleted

## 2023-05-02 DIAGNOSIS — Z952 Presence of prosthetic heart valve: Secondary | ICD-10-CM

## 2023-05-02 DIAGNOSIS — I639 Cerebral infarction, unspecified: Secondary | ICD-10-CM

## 2023-05-02 DIAGNOSIS — Z5181 Encounter for therapeutic drug level monitoring: Secondary | ICD-10-CM

## 2023-05-02 DIAGNOSIS — I4891 Unspecified atrial fibrillation: Secondary | ICD-10-CM

## 2023-05-02 LAB — POCT INR: INR: 1.4 — AB (ref 2.0–3.0)

## 2023-05-02 NOTE — Patient Instructions (Signed)
Description   Today take 1.5 tablets of warfarin and tomorrow take 1.5 tablets of warfarin then continue taking warfarin 1 tablet daily except for 1.5 tablets on Tuesday.  Recheck INR in 10 days.  Anticoagulation Clinic [phone] or [phone]

## 2023-05-08 NOTE — Progress Notes (Signed)
Remote pacemaker transmission.   

## 2023-05-14 ENCOUNTER — Ambulatory Visit: Payer: Medicare Other | Attending: Cardiology

## 2023-05-14 DIAGNOSIS — I4891 Unspecified atrial fibrillation: Secondary | ICD-10-CM | POA: Diagnosis not present

## 2023-05-14 DIAGNOSIS — Z5181 Encounter for therapeutic drug level monitoring: Secondary | ICD-10-CM

## 2023-05-14 DIAGNOSIS — Z952 Presence of prosthetic heart valve: Secondary | ICD-10-CM | POA: Diagnosis not present

## 2023-05-14 LAB — POCT INR: INR: 2.5 (ref 2.0–3.0)

## 2023-05-14 NOTE — Patient Instructions (Signed)
continue taking warfarin 1 tablet daily except for 1.5 tablets on Tuesday.  Recheck INR in 4 weeks Anticoagulation Clinic [phone] or [phone]

## 2023-06-11 ENCOUNTER — Ambulatory Visit: Payer: Medicare Other | Attending: Cardiology

## 2023-06-11 DIAGNOSIS — Z5181 Encounter for therapeutic drug level monitoring: Secondary | ICD-10-CM

## 2023-06-11 DIAGNOSIS — I4891 Unspecified atrial fibrillation: Secondary | ICD-10-CM

## 2023-06-11 DIAGNOSIS — Z952 Presence of prosthetic heart valve: Secondary | ICD-10-CM | POA: Diagnosis not present

## 2023-06-11 LAB — POCT INR: INR: 2.9 (ref 2.0–3.0)

## 2023-06-11 NOTE — Patient Instructions (Signed)
continue taking warfarin 1 tablet daily except for 1.5 tablets on Tuesday.  Recheck INR in 6 weeks Anticoagulation Clinic [phone] or [phone]

## 2023-07-23 ENCOUNTER — Ambulatory Visit: Payer: Medicare Other | Attending: Internal Medicine | Admitting: *Deleted

## 2023-07-23 DIAGNOSIS — I639 Cerebral infarction, unspecified: Secondary | ICD-10-CM

## 2023-07-23 DIAGNOSIS — Z952 Presence of prosthetic heart valve: Secondary | ICD-10-CM | POA: Diagnosis not present

## 2023-07-23 DIAGNOSIS — Z5181 Encounter for therapeutic drug level monitoring: Secondary | ICD-10-CM

## 2023-07-23 DIAGNOSIS — I4891 Unspecified atrial fibrillation: Secondary | ICD-10-CM | POA: Diagnosis not present

## 2023-07-23 LAB — POCT INR: INR: 2.4 (ref 2.0–3.0)

## 2023-07-23 NOTE — Patient Instructions (Signed)
Description   Today take 1.5 tablets of warfarin then continue taking warfarin 1 tablet daily except for 1.5 tablets on Tuesday.  Recheck INR in 6 weeks Anticoagulation Clinic [phone] or [phone]

## 2023-07-27 ENCOUNTER — Ambulatory Visit (INDEPENDENT_AMBULATORY_CARE_PROVIDER_SITE_OTHER): Payer: Medicare Other

## 2023-07-27 DIAGNOSIS — I442 Atrioventricular block, complete: Secondary | ICD-10-CM | POA: Diagnosis not present

## 2023-07-28 LAB — CUP PACEART REMOTE DEVICE CHECK
Battery Remaining Longevity: 142 mo
Battery Voltage: 3.04 V
Brady Statistic RV Percent Paced: 100 %
Date Time Interrogation Session: 20250207044145
Implantable Lead Connection Status: 753985
Implantable Lead Connection Status: 753985
Implantable Lead Implant Date: 20120120
Implantable Lead Implant Date: 20120120
Implantable Lead Location: 753859
Implantable Lead Location: 753860
Implantable Pulse Generator Implant Date: 20230509
Lead Channel Impedance Value: 418 Ohm
Lead Channel Impedance Value: 532 Ohm
Lead Channel Pacing Threshold Amplitude: 0.75 V
Lead Channel Pacing Threshold Pulse Width: 0.4 ms
Lead Channel Setting Pacing Amplitude: 2 V
Lead Channel Setting Pacing Pulse Width: 0.4 ms
Lead Channel Setting Sensing Sensitivity: 4 mV
Zone Setting Status: 755011

## 2023-08-29 NOTE — Addendum Note (Signed)
 Addended by: Elease Etienne A on: 08/29/2023 12:35 PM   Modules accepted: Orders

## 2023-08-29 NOTE — Progress Notes (Signed)
 Remote pacemaker transmission.

## 2023-09-03 ENCOUNTER — Ambulatory Visit: Payer: Medicare Other | Attending: Internal Medicine | Admitting: *Deleted

## 2023-09-03 DIAGNOSIS — I4891 Unspecified atrial fibrillation: Secondary | ICD-10-CM | POA: Diagnosis not present

## 2023-09-03 DIAGNOSIS — Z5181 Encounter for therapeutic drug level monitoring: Secondary | ICD-10-CM

## 2023-09-03 DIAGNOSIS — Z952 Presence of prosthetic heart valve: Secondary | ICD-10-CM | POA: Diagnosis not present

## 2023-09-03 DIAGNOSIS — I639 Cerebral infarction, unspecified: Secondary | ICD-10-CM

## 2023-09-03 LAB — POCT INR: INR: 1.8 — AB (ref 2.0–3.0)

## 2023-09-03 NOTE — Patient Instructions (Addendum)
 Description   Today take 1.5 tablets of warfarin then START taking warfarin 1 tablet daily except for 1.5 tablets on Tuesday and Saturday.  Recheck INR in 3 weeks (normally 6 weeks). Anticoagulation Clinic [phone] or [phone]

## 2023-09-20 ENCOUNTER — Other Ambulatory Visit: Payer: Self-pay | Admitting: Internal Medicine

## 2023-09-20 DIAGNOSIS — Z5181 Encounter for therapeutic drug level monitoring: Secondary | ICD-10-CM

## 2023-09-20 DIAGNOSIS — Z952 Presence of prosthetic heart valve: Secondary | ICD-10-CM

## 2023-09-20 DIAGNOSIS — I4891 Unspecified atrial fibrillation: Secondary | ICD-10-CM

## 2023-09-20 NOTE — Telephone Encounter (Signed)
 Prescription refill request received for warfarin Lov: 03/06/23 Ladona Ridgel)  Next INR check: 09/24/23 Warfarin tablet strength: 7.5mg   Appropriate dose. Refill sent.

## 2023-09-24 ENCOUNTER — Ambulatory Visit: Attending: Internal Medicine

## 2023-09-24 DIAGNOSIS — I4891 Unspecified atrial fibrillation: Secondary | ICD-10-CM | POA: Diagnosis not present

## 2023-09-24 DIAGNOSIS — Z5181 Encounter for therapeutic drug level monitoring: Secondary | ICD-10-CM

## 2023-09-24 DIAGNOSIS — Z952 Presence of prosthetic heart valve: Secondary | ICD-10-CM

## 2023-09-24 LAB — POCT INR: INR: 2.5 (ref 2.0–3.0)

## 2023-09-24 NOTE — Patient Instructions (Addendum)
 Continue  taking warfarin 1 tablet daily except for 1.5 tablets on Tuesday and Saturday.  Recheck INR in 6 weeks (normally 6 weeks). Anticoagulation Clinic [phone] or [phone].Marland Kitchen

## 2023-10-26 ENCOUNTER — Ambulatory Visit (INDEPENDENT_AMBULATORY_CARE_PROVIDER_SITE_OTHER): Payer: Medicare Other

## 2023-10-26 DIAGNOSIS — I442 Atrioventricular block, complete: Secondary | ICD-10-CM | POA: Diagnosis not present

## 2023-10-26 LAB — CUP PACEART REMOTE DEVICE CHECK
Battery Remaining Longevity: 138 mo
Battery Voltage: 3.03 V
Brady Statistic RV Percent Paced: 99.99 %
Date Time Interrogation Session: 20250509020511
Implantable Lead Connection Status: 753985
Implantable Lead Connection Status: 753985
Implantable Lead Implant Date: 20120120
Implantable Lead Implant Date: 20120120
Implantable Lead Location: 753859
Implantable Lead Location: 753860
Implantable Pulse Generator Implant Date: 20230509
Lead Channel Impedance Value: 418 Ohm
Lead Channel Impedance Value: 513 Ohm
Lead Channel Pacing Threshold Amplitude: 0.75 V
Lead Channel Pacing Threshold Pulse Width: 0.4 ms
Lead Channel Setting Pacing Amplitude: 2 V
Lead Channel Setting Pacing Pulse Width: 0.4 ms
Lead Channel Setting Sensing Sensitivity: 4 mV
Zone Setting Status: 755011

## 2023-10-27 ENCOUNTER — Other Ambulatory Visit: Payer: Self-pay | Admitting: Internal Medicine

## 2023-10-27 DIAGNOSIS — Z5181 Encounter for therapeutic drug level monitoring: Secondary | ICD-10-CM

## 2023-10-27 DIAGNOSIS — I4891 Unspecified atrial fibrillation: Secondary | ICD-10-CM

## 2023-10-27 DIAGNOSIS — Z952 Presence of prosthetic heart valve: Secondary | ICD-10-CM

## 2023-11-05 ENCOUNTER — Ambulatory Visit

## 2023-11-30 NOTE — Progress Notes (Signed)
 Remote pacemaker transmission.

## 2024-01-25 ENCOUNTER — Ambulatory Visit: Payer: Medicare (Managed Care)

## 2024-04-25 ENCOUNTER — Ambulatory Visit: Payer: Medicare Other

## 2024-07-25 ENCOUNTER — Ambulatory Visit: Payer: Medicare (Managed Care)

## 2024-10-24 ENCOUNTER — Ambulatory Visit: Payer: Medicare (Managed Care)

## 2025-01-23 ENCOUNTER — Ambulatory Visit: Payer: Medicare (Managed Care)

## 2025-04-24 ENCOUNTER — Ambulatory Visit: Payer: Medicare (Managed Care)
